# Patient Record
Sex: Female | Born: 1950 | Race: Black or African American | Hispanic: No | Marital: Married | Smoking: Never smoker
Health system: Southern US, Community
[De-identification: ages and names within clinical notes are randomized; demographics above are authoritative.]

## PROBLEM LIST (undated history)

## (undated) DIAGNOSIS — M1990 Unspecified osteoarthritis, unspecified site: Secondary | ICD-10-CM

## (undated) DIAGNOSIS — R112 Nausea with vomiting, unspecified: Secondary | ICD-10-CM

## (undated) DIAGNOSIS — M5136 Other intervertebral disc degeneration, lumbar region: Secondary | ICD-10-CM

## (undated) DIAGNOSIS — Z9889 Other specified postprocedural states: Secondary | ICD-10-CM

## (undated) DIAGNOSIS — M51369 Other intervertebral disc degeneration, lumbar region without mention of lumbar back pain or lower extremity pain: Secondary | ICD-10-CM

## (undated) DIAGNOSIS — E119 Type 2 diabetes mellitus without complications: Secondary | ICD-10-CM

## (undated) DIAGNOSIS — I10 Essential (primary) hypertension: Secondary | ICD-10-CM

## (undated) DIAGNOSIS — K219 Gastro-esophageal reflux disease without esophagitis: Secondary | ICD-10-CM

## (undated) HISTORY — PX: ABDOMINAL HYSTERECTOMY: SHX81

## (undated) HISTORY — PX: EYE SURGERY: SHX253

## (undated) HISTORY — PX: JOINT REPLACEMENT: SHX530

## (undated) HISTORY — PX: CHOLECYSTECTOMY: SHX55

---

## 2003-12-08 ENCOUNTER — Other Ambulatory Visit: Payer: Self-pay

## 2004-10-17 ENCOUNTER — Ambulatory Visit: Payer: Self-pay | Admitting: Obstetrics and Gynecology

## 2006-05-01 ENCOUNTER — Ambulatory Visit: Payer: Self-pay | Admitting: Obstetrics and Gynecology

## 2007-08-01 ENCOUNTER — Ambulatory Visit: Payer: Self-pay | Admitting: Obstetrics and Gynecology

## 2008-03-29 ENCOUNTER — Ambulatory Visit: Payer: Self-pay | Admitting: General Practice

## 2008-04-12 ENCOUNTER — Inpatient Hospital Stay: Payer: Self-pay | Admitting: General Practice

## 2008-05-11 ENCOUNTER — Encounter: Payer: Self-pay | Admitting: General Practice

## 2008-06-07 ENCOUNTER — Encounter: Payer: Self-pay | Admitting: General Practice

## 2008-07-05 ENCOUNTER — Encounter: Payer: Self-pay | Admitting: General Practice

## 2008-09-14 ENCOUNTER — Ambulatory Visit: Payer: Self-pay | Admitting: Obstetrics and Gynecology

## 2008-09-23 ENCOUNTER — Ambulatory Visit: Payer: Self-pay | Admitting: Obstetrics and Gynecology

## 2009-09-15 ENCOUNTER — Ambulatory Visit: Payer: Self-pay | Admitting: Obstetrics and Gynecology

## 2010-09-18 ENCOUNTER — Ambulatory Visit: Payer: Self-pay | Admitting: Obstetrics and Gynecology

## 2011-10-02 ENCOUNTER — Ambulatory Visit: Payer: Self-pay | Admitting: Obstetrics and Gynecology

## 2012-10-07 ENCOUNTER — Ambulatory Visit: Payer: Self-pay | Admitting: Obstetrics and Gynecology

## 2012-11-10 ENCOUNTER — Ambulatory Visit: Payer: Self-pay | Admitting: Unknown Physician Specialty

## 2013-08-24 DIAGNOSIS — IMO0002 Reserved for concepts with insufficient information to code with codable children: Secondary | ICD-10-CM | POA: Insufficient documentation

## 2013-09-16 DIAGNOSIS — K219 Gastro-esophageal reflux disease without esophagitis: Secondary | ICD-10-CM | POA: Insufficient documentation

## 2013-09-16 DIAGNOSIS — Z780 Asymptomatic menopausal state: Secondary | ICD-10-CM | POA: Insufficient documentation

## 2013-09-16 DIAGNOSIS — J302 Other seasonal allergic rhinitis: Secondary | ICD-10-CM | POA: Insufficient documentation

## 2013-09-16 DIAGNOSIS — E785 Hyperlipidemia, unspecified: Secondary | ICD-10-CM | POA: Insufficient documentation

## 2013-09-16 DIAGNOSIS — Z6841 Body Mass Index (BMI) 40.0 and over, adult: Secondary | ICD-10-CM | POA: Insufficient documentation

## 2013-09-16 DIAGNOSIS — E1169 Type 2 diabetes mellitus with other specified complication: Secondary | ICD-10-CM | POA: Insufficient documentation

## 2013-09-16 DIAGNOSIS — I10 Essential (primary) hypertension: Secondary | ICD-10-CM | POA: Insufficient documentation

## 2013-10-19 ENCOUNTER — Ambulatory Visit: Payer: Self-pay

## 2013-10-19 DIAGNOSIS — E7800 Pure hypercholesterolemia, unspecified: Secondary | ICD-10-CM | POA: Insufficient documentation

## 2013-10-21 ENCOUNTER — Ambulatory Visit: Payer: Self-pay

## 2014-09-24 ENCOUNTER — Other Ambulatory Visit: Payer: Self-pay | Admitting: Obstetrics and Gynecology

## 2014-09-24 DIAGNOSIS — Z1231 Encounter for screening mammogram for malignant neoplasm of breast: Secondary | ICD-10-CM

## 2014-10-25 ENCOUNTER — Ambulatory Visit
Admission: RE | Admit: 2014-10-25 | Discharge: 2014-10-25 | Disposition: A | Discharge status: Home / Self Care | Payer: Medicare Other | Source: Ambulatory Visit | Attending: Obstetrics and Gynecology | Admitting: Obstetrics and Gynecology

## 2014-10-25 ENCOUNTER — Other Ambulatory Visit: Payer: Self-pay | Admitting: Obstetrics and Gynecology

## 2014-10-25 DIAGNOSIS — Z1231 Encounter for screening mammogram for malignant neoplasm of breast: Secondary | ICD-10-CM | POA: Diagnosis present

## 2015-10-10 ENCOUNTER — Other Ambulatory Visit: Payer: Self-pay | Admitting: Obstetrics and Gynecology

## 2015-10-25 ENCOUNTER — Other Ambulatory Visit: Payer: Self-pay | Admitting: Family Medicine

## 2015-10-25 DIAGNOSIS — Z1231 Encounter for screening mammogram for malignant neoplasm of breast: Secondary | ICD-10-CM

## 2015-11-10 ENCOUNTER — Other Ambulatory Visit: Payer: Self-pay | Admitting: Family Medicine

## 2015-11-10 ENCOUNTER — Ambulatory Visit
Admission: RE | Admit: 2015-11-10 | Discharge: 2015-11-10 | Disposition: A | Discharge status: Home / Self Care | Payer: Medicare Other | Source: Ambulatory Visit | Attending: Family Medicine | Admitting: Family Medicine

## 2015-11-10 DIAGNOSIS — Z1231 Encounter for screening mammogram for malignant neoplasm of breast: Secondary | ICD-10-CM | POA: Insufficient documentation

## 2016-10-25 ENCOUNTER — Other Ambulatory Visit: Payer: Self-pay | Admitting: Family Medicine

## 2016-10-25 DIAGNOSIS — Z1231 Encounter for screening mammogram for malignant neoplasm of breast: Secondary | ICD-10-CM

## 2016-11-12 ENCOUNTER — Ambulatory Visit
Admission: RE | Admit: 2016-11-12 | Discharge: 2016-11-12 | Disposition: A | Discharge status: Home / Self Care | Payer: Medicare Other | Source: Ambulatory Visit | Attending: Family Medicine | Admitting: Family Medicine

## 2016-11-12 DIAGNOSIS — Z1231 Encounter for screening mammogram for malignant neoplasm of breast: Secondary | ICD-10-CM | POA: Diagnosis not present

## 2017-11-05 ENCOUNTER — Other Ambulatory Visit: Payer: Self-pay | Admitting: Family Medicine

## 2017-11-05 DIAGNOSIS — Z1231 Encounter for screening mammogram for malignant neoplasm of breast: Secondary | ICD-10-CM

## 2017-11-20 DIAGNOSIS — Z96652 Presence of left artificial knee joint: Secondary | ICD-10-CM | POA: Insufficient documentation

## 2017-11-28 ENCOUNTER — Ambulatory Visit
Admission: RE | Admit: 2017-11-28 | Discharge: 2017-11-28 | Disposition: A | Discharge status: Home / Self Care | Payer: Medicare Other | Source: Ambulatory Visit | Attending: Family Medicine | Admitting: Family Medicine

## 2017-11-28 DIAGNOSIS — Z1231 Encounter for screening mammogram for malignant neoplasm of breast: Secondary | ICD-10-CM | POA: Diagnosis not present

## 2018-11-03 ENCOUNTER — Other Ambulatory Visit: Payer: Self-pay | Admitting: Family Medicine

## 2018-11-03 DIAGNOSIS — Z1231 Encounter for screening mammogram for malignant neoplasm of breast: Secondary | ICD-10-CM

## 2018-12-11 ENCOUNTER — Ambulatory Visit
Admission: RE | Admit: 2018-12-11 | Discharge: 2018-12-11 | Disposition: A | Discharge status: Home / Self Care | Payer: Medicare Other | Source: Ambulatory Visit | Attending: Family Medicine | Admitting: Family Medicine

## 2018-12-11 DIAGNOSIS — Z1231 Encounter for screening mammogram for malignant neoplasm of breast: Secondary | ICD-10-CM | POA: Diagnosis present

## 2018-12-16 DIAGNOSIS — Z862 Personal history of diseases of the blood and blood-forming organs and certain disorders involving the immune mechanism: Secondary | ICD-10-CM | POA: Insufficient documentation

## 2019-11-03 ENCOUNTER — Other Ambulatory Visit: Payer: Self-pay | Admitting: Family Medicine

## 2019-11-03 DIAGNOSIS — Z1231 Encounter for screening mammogram for malignant neoplasm of breast: Secondary | ICD-10-CM

## 2019-12-14 ENCOUNTER — Ambulatory Visit
Admission: RE | Admit: 2019-12-14 | Discharge: 2019-12-14 | Disposition: A | Discharge status: Home / Self Care | Payer: Medicare Other | Source: Ambulatory Visit | Attending: Family Medicine | Admitting: Family Medicine

## 2019-12-14 ENCOUNTER — Other Ambulatory Visit: Payer: Self-pay

## 2019-12-14 DIAGNOSIS — Z1231 Encounter for screening mammogram for malignant neoplasm of breast: Secondary | ICD-10-CM | POA: Diagnosis present

## 2020-09-30 NOTE — Discharge Instructions (Signed)
Instructions after Total Knee Replacement   Krystal Rodriguez, Jr., M.D.     Dept. of Orthopaedics & Sports Medicine  [facility]  [address]  [place], [place]  [address]  Phone: [phone]   Fax: [phone]    DIET: Drink plenty of non-alcoholic fluids. Resume your normal diet. Include foods high in fiber.  ACTIVITY:  You may use crutches or a walker with weight-bearing as tolerated, unless instructed otherwise. You may be weaned off of the walker or crutches by your Physical Therapist.  Do NOT place pillows under the knee. Anything placed under the knee could limit your ability to straighten the knee.   Continue doing gentle exercises. Exercising will reduce the pain and swelling, increase motion, and prevent muscle weakness.   Please continue to use the TED compression stockings for 6 weeks. You may remove the stockings at night, but should reapply them in the morning. Do not drive or operate any equipment until instructed.  WOUND CARE:  Continue to use the PolarCare or ice packs periodically to reduce pain and swelling. You may bathe or shower after the staples are removed at the first office visit following surgery.  MEDICATIONS: You may resume your regular medications. Please take the pain medication as prescribed on the medication. Do not take pain medication on an empty stomach. You have been given a prescription for a blood thinner (Lovenox or Coumadin). Please take the medication as instructed. (NOTE: After completing a 2 week course of Lovenox, take one Enteric-coated aspirin once a day. This along with elevation will help reduce the possibility of phlebitis in your operated leg.) Do not drive or drink alcoholic beverages when taking pain medications.  CALL THE OFFICE FOR: Temperature above 101 degrees Excessive bleeding or drainage on the dressing. Excessive swelling, coldness, or paleness of the toes. Persistent nausea and vomiting.  FOLLOW-UP:  You  should have an appointment to return to the office in 10-14 days after surgery. Arrangements have been made for continuation of Physical Therapy (either home therapy or outpatient therapy).   [facility] Department Directory         [email or web address]       [email or web address]          Cardiology  Appointments: [place] - [phone] [place] - [phone]  Endocrinology  Appointments: [place] - [phone] [place] - [phone]  Gastroenterology  Appointments: [place] - [phone] [place] - [phone]        General Surgery   Appointments: [place] - [phone]  Internal Medicine/Family Medicine  Appointments: [place] - [phone] [place] - [phone] [place] - [phone]  Metabolic and Weigh Loss Surgery  Appointments: [place] - [phone]        Neurology  Appointments: [place] - [phone] [place] - [phone]  Neurosurgery  Appointments: [place] - [phone]  Obstetrics & Gynecology  Appointments: [place] - [phone] [place] - [phone]        Pediatrics  Appointments: [place] - [phone] [place] - [phone]  Physiatry  Appointments: [place] -[phone]  Physical Therapy  Appointments: [place] - [phone] [place] - [phone]        Podiatry  Appointments: [place] - [phone] [place] - [phone]  Pulmonology  Appointments: [place] - [phone]  Rheumatology  Appointments: [place] - [phone]        [place] Location: [facility]  [address] [place], [place]  [address]  [place] Location: [facility] [address] [place], [place]  [address]  [place] Location: [facility] [address] [place], [place]  [address]    

## 2020-10-05 DIAGNOSIS — U071 COVID-19: Secondary | ICD-10-CM

## 2020-10-05 HISTORY — DX: COVID-19: U07.1

## 2020-10-25 ENCOUNTER — Other Ambulatory Visit: Payer: Self-pay

## 2020-10-25 ENCOUNTER — Other Ambulatory Visit
Admission: RE | Admit: 2020-10-25 | Discharge: 2020-10-25 | Disposition: A | Discharge status: Home / Self Care | Payer: Medicare Other | Source: Ambulatory Visit | Attending: Orthopedic Surgery | Admitting: Orthopedic Surgery

## 2020-10-25 DIAGNOSIS — Z01818 Encounter for other preprocedural examination: Secondary | ICD-10-CM | POA: Insufficient documentation

## 2020-10-25 HISTORY — DX: Unspecified osteoarthritis, unspecified site: M19.90

## 2020-10-25 HISTORY — DX: Type 2 diabetes mellitus without complications: E11.9

## 2020-10-25 HISTORY — DX: Essential (primary) hypertension: I10

## 2020-10-25 HISTORY — DX: Gastro-esophageal reflux disease without esophagitis: K21.9

## 2020-10-25 LAB — CBC
HCT: 36.4 % (ref 36.0–46.0)
Hemoglobin: 10.8 g/dL — ABNORMAL LOW (ref 12.0–15.0)
MCH: 23.7 pg — ABNORMAL LOW (ref 26.0–34.0)
MCHC: 29.7 g/dL — ABNORMAL LOW (ref 30.0–36.0)
MCV: 80 fL (ref 80.0–100.0)
Platelets: 242 10*3/uL (ref 150–400)
RBC: 4.55 MIL/uL (ref 3.87–5.11)
RDW: 15 % (ref 11.5–15.5)
WBC: 8.3 10*3/uL (ref 4.0–10.5)
nRBC: 0 % (ref 0.0–0.2)

## 2020-10-25 LAB — URINALYSIS, ROUTINE W REFLEX MICROSCOPIC
Bilirubin Urine: NEGATIVE
Glucose, UA: NEGATIVE mg/dL
Hgb urine dipstick: NEGATIVE
Ketones, ur: NEGATIVE mg/dL
Leukocytes,Ua: NEGATIVE
Nitrite: NEGATIVE
Protein, ur: NEGATIVE mg/dL
Specific Gravity, Urine: 1.019 (ref 1.005–1.030)
pH: 5 (ref 5.0–8.0)

## 2020-10-25 LAB — PROTIME-INR
INR: 1 (ref 0.8–1.2)
Prothrombin Time: 13.7 seconds (ref 11.4–15.2)

## 2020-10-25 LAB — COMPREHENSIVE METABOLIC PANEL
ALT: 11 U/L (ref 0–44)
AST: 18 U/L (ref 15–41)
Albumin: 3.5 g/dL (ref 3.5–5.0)
Alkaline Phosphatase: 53 U/L (ref 38–126)
Anion gap: 6 (ref 5–15)
BUN: 15 mg/dL (ref 8–23)
CO2: 32 mmol/L (ref 22–32)
Calcium: 8.8 mg/dL — ABNORMAL LOW (ref 8.9–10.3)
Chloride: 101 mmol/L (ref 98–111)
Creatinine, Ser: 0.85 mg/dL (ref 0.44–1.00)
GFR, Estimated: >60 mL/min (ref >60)
Glucose, Bld: 100 mg/dL — ABNORMAL HIGH (ref 70–99)
Potassium: 3.8 mmol/L (ref 3.5–5.1)
Sodium: 139 mmol/L (ref 135–145)
Total Bilirubin: 0.6 mg/dL (ref 0.3–1.2)
Total Protein: 7.3 g/dL (ref 6.5–8.1)

## 2020-10-25 LAB — APTT: aPTT: 31 seconds (ref 24–36)

## 2020-10-25 LAB — TYPE AND SCREEN
ABO/RH(D): O POS
Antibody Screen: NEGATIVE

## 2020-10-25 LAB — SURGICAL PCR SCREEN
MRSA, PCR: NEGATIVE
Staphylococcus aureus: POSITIVE — AB

## 2020-10-25 LAB — SEDIMENTATION RATE: Sed Rate: 45 mm/hr — ABNORMAL HIGH (ref 0–30)

## 2020-10-25 LAB — C-REACTIVE PROTEIN: CRP: 3.1 mg/dL — ABNORMAL HIGH (ref <1.0)

## 2020-10-25 NOTE — Patient Instructions (Addendum)
Your procedure is scheduled on: Friday 11/04/20 Report to Day Surgery.  Go to registration desk in medical mall first To find out your arrival time please call [phone] between 1PM - 3PM on Thurs. 11/03/20.  Remember: Instructions that are not followed completely may result in serious medical risk,  up to and including death, or upon the discretion of your surgeon and anesthesiologist your  surgery may need to be rescheduled.     _X__ 1. Do not eat food after midnight the night before your procedure.                 No chewing gum or hard candies. You may drink clear liquids up to 2 hours                 before you are scheduled to arrive for your surgery- DO not drink clear                 liquids within 2 hours of the start of your surgery.                 Clear Liquids include:  water,  G2 or                  Gatorade Zero (avoid Red/Purple/Blue), Black Coffee or Tea (Do not add                 anything to coffee or tea). ___x__2.    **If you  are diabetic you will be provided with an alternative drink, Gatorade Zero or G2.  __X__2.  On the morning of surgery brush your teeth with toothpaste and water, you                may rinse your mouth with mouthwash if you wish.  Do not swallow any toothpaste of mouthwash.     __ 3.  No Alcohol for 24 hours before or after surgery.   ___ 4.  Do Not Smoke or use e-cigarettes For 24 Hours Prior to Your Surgery.                 Do not use any chewable tobacco products for at least 6 hours prior to                 Surgery.  ___  5.  Do not use any recreational drugs (marijuana, cocaine, heroin, ecstasy, MDMA or other)                For at least one week prior to your surgery.  Combination of these drugs with anesthesia                May have life threatening results.  ____  6.  Bring all medications with you on the day of surgery if instructed.   __x__  7.  Notify your doctor if there is any change in your medical  condition      (cold, fever, infections).     Do not wear jewelry, make-up, hairpins, clips or nail polish. Do not wear lotions, powders, or perfumes. You may wear deodorant. Do not shave 48 hours prior to surgery.  Do not bring valuables to the hospital.    [facility] is not responsible for any belongings or valuables.  Contacts, dentures or bridgework may not be worn into surgery. Leave your suitcase in the car. After surgery it may be brought to your room. For patients admitted to the hospital, discharge  time is determined by your treatment team.   Patients discharged the day of surgery will not be allowed to drive home.   Make arrangements for someone to be with you for the first 24 hours of your Same Day Discharge.    Please read over the following fact sheets that you were given:   Incentive spiromerter    ___x_ Take these medicines the morning of surgery with A SIP OF WATER:    1. acetaminophen (TYLENOL) 500 MG tablet if needed  2. omeprazole (PRILOSEC) 20 MG capsule  take dose the night before and the morning of surgery  3. May take allergy medication if needed  4.  5.  6.  ____ Fleet Enema (as directed)   __x__ Use CHG Soap (or wipes) as directed  ____ Use Benzoyl Peroxide Gel as instructed  ____ Use inhalers on the day of surgery  __x__ Stop metformin 2 days prior to surgery Last dose on Tues. 6/28   ____ Take 1/2 of usual insulin dose the night before surgery. No insulin the morning          of surgery.   __x__ Stop aspirin 1 week prior to surgery  ___x_ Stop Anti-inflammatories no ibuprofen aleve 1 week prior to surgery   ___x_ Stop supplements until after surgery.  No need to stop Vit. D  Please stop Garlic 1 week prior to surgery.  ____ Bring C-Pap to the hospital.    If you have any questions regarding your pre-procedure instructions,  Please call Pre-admit Testing at [phone] [facility]

## 2020-10-26 LAB — HEMOGLOBIN A1C
Hgb A1c MFr Bld: 6.4 % — ABNORMAL HIGH (ref 4.8–5.6)
Mean Plasma Glucose: 137 mg/dL

## 2020-10-26 LAB — URINE CULTURE
Culture: 20000 — AB
Special Requests: NORMAL

## 2020-11-02 ENCOUNTER — Other Ambulatory Visit: Payer: Self-pay

## 2020-11-02 ENCOUNTER — Other Ambulatory Visit
Admission: RE | Admit: 2020-11-02 | Discharge: 2020-11-02 | Disposition: A | Discharge status: Home / Self Care | Payer: Medicare Other | Source: Ambulatory Visit | Attending: Orthopedic Surgery | Admitting: Orthopedic Surgery

## 2020-11-02 DIAGNOSIS — U071 COVID-19: Secondary | ICD-10-CM | POA: Diagnosis not present

## 2020-11-02 DIAGNOSIS — Z01812 Encounter for preprocedural laboratory examination: Secondary | ICD-10-CM | POA: Insufficient documentation

## 2020-11-02 LAB — SARS CORONAVIRUS 2 (TAT 6-24 HRS): SARS Coronavirus 2: POSITIVE — AB

## 2020-11-03 ENCOUNTER — Encounter (HOSPITAL_COMMUNITY): Payer: Self-pay | Admitting: Anesthesiology

## 2020-11-04 ENCOUNTER — Inpatient Hospital Stay: Admission: RE | Admit: 2020-11-04 | Payer: Medicare Other | Source: Home / Self Care | Admitting: Orthopedic Surgery

## 2020-11-04 ENCOUNTER — Encounter: Admission: RE | Payer: Self-pay | Source: Home / Self Care

## 2020-11-04 SURGERY — ARTHROPLASTY, KNEE, TOTAL, USING IMAGELESS COMPUTER-ASSISTED NAVIGATION
Anesthesia: Choice | Site: Knee | Laterality: Right

## 2020-11-08 ENCOUNTER — Other Ambulatory Visit: Payer: Self-pay | Admitting: Family Medicine

## 2020-11-08 DIAGNOSIS — Z1231 Encounter for screening mammogram for malignant neoplasm of breast: Secondary | ICD-10-CM

## 2020-12-14 ENCOUNTER — Ambulatory Visit
Admission: RE | Admit: 2020-12-14 | Discharge: 2020-12-14 | Disposition: A | Discharge status: Home / Self Care | Payer: Medicare Other | Source: Ambulatory Visit | Attending: Family Medicine | Admitting: Family Medicine

## 2020-12-14 ENCOUNTER — Other Ambulatory Visit: Payer: Self-pay

## 2020-12-14 DIAGNOSIS — Z1231 Encounter for screening mammogram for malignant neoplasm of breast: Secondary | ICD-10-CM | POA: Diagnosis not present

## 2021-01-08 NOTE — Discharge Instructions (Signed)
Instructions after Total Knee Replacement   Edsel Shives P. Zyionna Pesce, Jr., M.D.     Dept. of Orthopaedics & Sports Medicine  [facility]  [address]  [place], [place]  [address]  Phone: [phone]   Fax: [phone]    DIET: Drink plenty of non-alcoholic fluids. Resume your normal diet. Include foods high in fiber.  ACTIVITY:  You may use crutches or a walker with weight-bearing as tolerated, unless instructed otherwise. You may be weaned off of the walker or crutches by your Physical Therapist.  Do NOT place pillows under the knee. Anything placed under the knee could limit your ability to straighten the knee.   Continue doing gentle exercises. Exercising will reduce the pain and swelling, increase motion, and prevent muscle weakness.   Please continue to use the TED compression stockings for 6 weeks. You may remove the stockings at night, but should reapply them in the morning. Do not drive or operate any equipment until instructed.  WOUND CARE:  Continue to use the PolarCare or ice packs periodically to reduce pain and swelling. You may bathe or shower after the staples are removed at the first office visit following surgery.  MEDICATIONS: You may resume your regular medications. Please take the pain medication as prescribed on the medication. Do not take pain medication on an empty stomach. You have been given a prescription for a blood thinner (Lovenox or Coumadin). Please take the medication as instructed. (NOTE: After completing a 2 week course of Lovenox, take one Enteric-coated aspirin once a day. This along with elevation will help reduce the possibility of phlebitis in your operated leg.) Do not drive or drink alcoholic beverages when taking pain medications.  CALL THE OFFICE FOR: Temperature above 101 degrees Excessive bleeding or drainage on the dressing. Excessive swelling, coldness, or paleness of the toes. Persistent nausea and vomiting.  FOLLOW-UP:  You  should have an appointment to return to the office in 10-14 days after surgery. Arrangements have been made for continuation of Physical Therapy (either home therapy or outpatient therapy).   [facility] Department Directory         [email or web address]       [email or web address]          Cardiology  Appointments: [place] - [phone] [place] - [phone]  Endocrinology  Appointments: [place] - [phone] [place] - [phone]  Gastroenterology  Appointments: [place] - [phone] [place] - [phone]        General Surgery   Appointments: [place] - [phone]  Internal Medicine/Family Medicine  Appointments: [place] - [phone] [place] - [phone] [place] - [phone]  Metabolic and Weigh Loss Surgery  Appointments: [place] - [phone]        Neurology  Appointments: [place] - [phone] [place] - [phone]  Neurosurgery  Appointments: [place] - [phone]  Obstetrics & Gynecology  Appointments: [place] - [phone] [place] - [phone]        Pediatrics  Appointments: [place] - [phone] [place] - [phone]  Physiatry  Appointments: [place] -[phone]  Physical Therapy  Appointments: [place] - [phone] [place] - [phone]        Podiatry  Appointments: [place] - [phone] [place] - [phone]  Pulmonology  Appointments: [place] - [phone]  Rheumatology  Appointments: [place] - [phone]        [place] Location: [facility]  [address] , [place]  [address]  [place] Location: [facility] [address] [place], [place]  [address]  [place] Location: [facility] [address] [place], [place]  [address]    

## 2021-01-12 ENCOUNTER — Other Ambulatory Visit: Payer: Self-pay

## 2021-01-12 ENCOUNTER — Encounter
Admission: RE | Admit: 2021-01-12 | Discharge: 2021-01-12 | Disposition: A | Discharge status: Home / Self Care | Payer: Medicare Other | Source: Ambulatory Visit | Attending: Orthopedic Surgery | Admitting: Orthopedic Surgery

## 2021-01-12 DIAGNOSIS — Z01812 Encounter for preprocedural laboratory examination: Secondary | ICD-10-CM | POA: Diagnosis not present

## 2021-01-12 DIAGNOSIS — Z112 Encounter for screening for other bacterial diseases: Secondary | ICD-10-CM | POA: Insufficient documentation

## 2021-01-12 HISTORY — DX: Other intervertebral disc degeneration, lumbar region without mention of lumbar back pain or lower extremity pain: M51.369

## 2021-01-12 HISTORY — DX: Other intervertebral disc degeneration, lumbar region: M51.36

## 2021-01-12 HISTORY — DX: Other specified postprocedural states: Z98.890

## 2021-01-12 HISTORY — DX: Nausea with vomiting, unspecified: R11.2

## 2021-01-12 LAB — SURGICAL PCR SCREEN
MRSA, PCR: NEGATIVE
Staphylococcus aureus: POSITIVE — AB

## 2021-01-12 LAB — PROTIME-INR
INR: 1 (ref 0.8–1.2)
Prothrombin Time: 13.4 seconds (ref 11.4–15.2)

## 2021-01-12 LAB — CBC
HCT: 38.5 % (ref 36.0–46.0)
Hemoglobin: 11.7 g/dL — ABNORMAL LOW (ref 12.0–15.0)
MCH: 24.2 pg — ABNORMAL LOW (ref 26.0–34.0)
MCHC: 30.4 g/dL (ref 30.0–36.0)
MCV: 79.5 fL — ABNORMAL LOW (ref 80.0–100.0)
Platelets: 263 10*3/uL (ref 150–400)
RBC: 4.84 MIL/uL (ref 3.87–5.11)
RDW: 14.8 % (ref 11.5–15.5)
WBC: 10.1 10*3/uL (ref 4.0–10.5)
nRBC: 0 % (ref 0.0–0.2)

## 2021-01-12 LAB — TYPE AND SCREEN
ABO/RH(D): O POS
Antibody Screen: NEGATIVE

## 2021-01-12 LAB — URINALYSIS, ROUTINE W REFLEX MICROSCOPIC
Bacteria, UA: NONE SEEN
Bilirubin Urine: NEGATIVE
Glucose, UA: NEGATIVE mg/dL
Hgb urine dipstick: NEGATIVE
Ketones, ur: NEGATIVE mg/dL
Leukocytes,Ua: NEGATIVE
Nitrite: NEGATIVE
Protein, ur: NEGATIVE mg/dL
Specific Gravity, Urine: 1.01 (ref 1.005–1.030)
pH: 6.5 (ref 5.0–8.0)

## 2021-01-12 LAB — COMPREHENSIVE METABOLIC PANEL
ALT: 15 U/L (ref 0–44)
AST: 25 U/L (ref 15–41)
Albumin: 3.7 g/dL (ref 3.5–5.0)
Alkaline Phosphatase: 56 U/L (ref 38–126)
Anion gap: 8 (ref 5–15)
BUN: 18 mg/dL (ref 8–23)
CO2: 30 mmol/L (ref 22–32)
Calcium: 8.9 mg/dL (ref 8.9–10.3)
Chloride: 100 mmol/L (ref 98–111)
Creatinine, Ser: 0.97 mg/dL (ref 0.44–1.00)
GFR, Estimated: >60 mL/min (ref >60)
Glucose, Bld: 92 mg/dL (ref 70–99)
Potassium: 4 mmol/L (ref 3.5–5.1)
Sodium: 138 mmol/L (ref 135–145)
Total Bilirubin: 0.6 mg/dL (ref 0.3–1.2)
Total Protein: 7.4 g/dL (ref 6.5–8.1)

## 2021-01-12 LAB — HEMOGLOBIN A1C
Hgb A1c MFr Bld: 6.2 % — ABNORMAL HIGH (ref 4.8–5.6)
Mean Plasma Glucose: 131.24 mg/dL

## 2021-01-12 LAB — APTT: aPTT: 32 seconds (ref 24–36)

## 2021-01-12 LAB — SEDIMENTATION RATE: Sed Rate: 33 mm/hr — ABNORMAL HIGH (ref 0–30)

## 2021-01-12 LAB — C-REACTIVE PROTEIN: CRP: 2.7 mg/dL — ABNORMAL HIGH (ref <1.0)

## 2021-01-12 NOTE — Patient Instructions (Addendum)
Your procedure is scheduled on:01-25-21 Wednesday Report to the Registration Desk on the 1st floor of the Medical Mall.Then proceed to the 2nd floor Surgery Desk in the Medical Mall To find out your arrival time, please call [phone] between 1PM - 3PM on:01-24-21 Tuesday  REMEMBER: Instructions that are not followed completely may result in serious medical risk, up to and including death; or upon the discretion of your surgeon and anesthesiologist your surgery may need to be rescheduled.  Do not eat food after midnight the night before surgery.  No gum chewing, lozengers or hard candies.  You may however, drink Water up to 2 hours before you are scheduled to arrive for your surgery. Do not drink anything within 2 hours of your scheduled arrival time  Type 1 and Type 2 diabetics should only drink water.  TAKE THESE MEDICATIONS THE MORNING OF SURGERY WITH A SIP OF WATER: -Prilosec (Omeprazole)-take one the night before and one on the morning of surgery - helps to prevent nausea after surgery.)  Stop Metformin 2 days prior to surgery-Last dose on 01-22-21 Sunday  Stop your 81 mg Aspirin 7 days prior to surgery-Last dose on 01-17-21 Tuesday  One week prior to surgery: Stop Anti-inflammatories (NSAIDS) such as Advil, Aleve, Ibuprofen, Motrin, Naproxen, Naprosyn and Aspirin based products such as Excedrin, Goodys Powder, BC Powder.You may however, continue to take Tylenol if needed for pain up until the day of surgery.  Stop ANY OVER THE COUNTER supplements/vitamins 7 days prior to surgery (Vitamin D3)  No Alcohol for 24 hours before or after surgery.  No Smoking including e-cigarettes for 24 hours prior to surgery.  No chewable tobacco products for at least 6 hours prior to surgery.  No nicotine patches on the day of surgery.  Do not use any "recreational" drugs for at least a week prior to your surgery.  Please be advised that the combination of cocaine and anesthesia may have negative  outcomes, up to and including death. If you test positive for cocaine, your surgery will be cancelled.  On the morning of surgery brush your teeth with toothpaste and water, you may rinse your mouth with mouthwash if you wish. Do not swallow any toothpaste or mouthwash.  Do not wear jewelry, make-up, hairpins, clips or nail polish.  Do not wear lotions, powders, or perfumes.   Do not shave body from the neck down 48 hours prior to surgery just in case you cut yourself which could leave a site for infection.  Also, freshly shaved skin may become irritated if using the CHG soap.  Contact lenses, hearing aids and dentures may not be worn into surgery.  Do not bring valuables to the hospital. [facility] is not responsible for any missing/lost belongings or valuables.   Use CHG Soap as directed on instruction sheet.  Notify your doctor if there is any change in your medical condition (cold, fever, infection).  Wear comfortable clothing (specific to your surgery type) to the hospital.  After surgery, you can help prevent lung complications by doing breathing exercises.  Take deep breaths and cough every 1-2 hours. Your doctor may order a device called an Incentive Spirometer to help you take deep breaths. When coughing or sneezing, hold a pillow firmly against your incision with both hands. This is called "splinting." Doing this helps protect your incision. It also decreases belly discomfort.  If you are being admitted to the hospital overnight, leave your suitcase in the car. After surgery it may be brought  to your room.  If you are being discharged the day of surgery, you will not be allowed to drive home. You will need a responsible adult (18 years or older) to drive you home and stay with you that night.   If you are taking public transportation, you will need to have a responsible adult (18 years or older) with you. Please confirm with your physician that it is acceptable to use  public transportation.   Please call the Pre-admissions Testing Dept. at [phone] if you have any questions about these instructions.  Surgery Visitation Policy:  Patients undergoing a surgery or procedure may have one family member or support person with them as long as that person is not COVID-19 positive or experiencing its symptoms.  That person may remain in the waiting area during the procedure.  Inpatient Visitation:    Visiting hours are 7 a.m. to 8 p.m. Inpatients will be allowed two visitors daily. The visitors may change each day during the patient's stay. No visitors under the age of 5. Any visitor under the age of 29 must be accompanied by an adult. The visitor must pass COVID-19 screenings, use hand sanitizer when entering and exiting the patient's room and wear a mask at all times, including in the patient's room. Patients must also wear a mask when staff or their visitor are in the room. Masking is required regardless of vaccination status.

## 2021-01-14 LAB — URINE CULTURE: Special Requests: NORMAL

## 2021-01-23 ENCOUNTER — Other Ambulatory Visit: Payer: Self-pay

## 2021-01-23 ENCOUNTER — Other Ambulatory Visit
Admission: RE | Admit: 2021-01-23 | Discharge: 2021-01-23 | Disposition: A | Discharge status: Home / Self Care | Payer: Medicare Other | Source: Ambulatory Visit | Attending: Orthopedic Surgery | Admitting: Orthopedic Surgery

## 2021-01-23 DIAGNOSIS — Z20822 Contact with and (suspected) exposure to covid-19: Secondary | ICD-10-CM | POA: Insufficient documentation

## 2021-01-23 DIAGNOSIS — Z01812 Encounter for preprocedural laboratory examination: Secondary | ICD-10-CM | POA: Insufficient documentation

## 2021-01-23 LAB — SARS CORONAVIRUS 2 (TAT 6-24 HRS): SARS Coronavirus 2: NEGATIVE

## 2021-01-24 NOTE — H&P (Signed)
ORTHOPAEDIC HISTORY & PHYSICAL Michelene Gardener, [place] - 01/17/2021 9:45 AM EDT Formatting of this note is different from the original. [facility] Chief Complaint:   Chief Complaint  Patient presents with   Knee Pain  H & P RIGHT KNEE   History of Present Illness:   Krystal Rodriguez is a 70 y.o. female that presents to clinic today for her preoperative history and evaluation. Patient presents unaccompanied. The patient is scheduled to undergo a right total knee arthroplasty on 01/25/21 by Dr. Ernest Pine. Her pain began several years ago. The pain is located primarily along the medial aspect of the knee. She describes her pain as worse with weightbearing. She reports associated swelling with significant giving way of the knee. She denies associated numbness or tingling, denies locking.   The patient's symptoms have progressed to the point that they decrease her quality of life. The patient has previously undergone conservative treatment including NSAIDS and injections to the knee without adequate control of her symptoms.  Patient lives at home with her husband. Denies significant cardiac history, history of blood clots, or lumbar surgery.   Patient works as bus Archivist.  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   blood glucose diagnostic (CONTOUR NEXT STRIPS) test strip Use 1 each as directed. Check CBG's twice weekly. Dx: 250.00 50 each 11   cholecalciferol (VITAMIN D3) 1,000 unit capsule Take 5,000 Units by mouth once daily   enalapril (VASOTEC) 10 MG tablet TAKE 1 TABLET BY MOUTH EVERY DAY 90 tablet 1   garlic 1,000 mg Cap Take 1 capsule by mouth once daily   lancets (MICROLET LANCET) Use 1 each as directed. Check CBG's twice weekly. Dx: 250.00 50 each 11   levocetirizine (XYZAL) 5 MG tablet Take 5 mg by mouth every evening   metFORMIN (GLUCOPHAGE) 1000 MG tablet TAKE 1 TABLET BY MOUTH TWICE A DAY WITH MEALS 180 tablet 1   montelukast (SINGULAIR) 10 mg tablet TAKE 1 TABLET BY MOUTH EVERY DAY IN THE EVENING 90 tablet 1   omeprazole (PRILOSEC) 20 MG DR capsule TAKE 1 CAPSULE BY MOUTH EVERY DAY 90 capsule 1   pravastatin (PRAVACHOL) 10 MG tablet TAKE 1 TABLET BY MOUTH EVERY DAY AT NIGHT 90 tablet 1   triamterene-hydrochlorothiazide (MAXZIDE-25) 37.5-25 mg tablet TAKE 1 TABLET BY MOUTH EVERY DAY 90 tablet 1   cetirizine (ZYRTEC) 10 MG tablet Take 10 mg by mouth every morning   No current facility-administered medications for this visit.   Allergies: No Known Allergies  Social History:  Social History   Socioeconomic History   Marital status: Married  Spouse name: Chrissie Noa   Number of children: 4   Years of education: 12   Highest education level: High school  graduate  Occupational History   Occupation: Part-time- Engineer, maintenance (IT)  Tobacco Use   Smoking status: Never Smoker   Smokeless tobacco: Never Used  Building services engineer Use: Never used  Substance and Sexual Activity   Alcohol use: No   Drug use: No   Sexual activity: Defer  Social History Narrative  Marital Status- Married  Lives with husband and 2 children  Employment- Retired  Exercise hx- Occasional- exercises on treadmill  Religious Affiliation- None    Family History:  Family History  Problem Relation Age of Onset   No Known Problems Father   No Known Problems Mother   Review of Systems:   A 10+ ROS was performed, reviewed, and the pertinent orthopaedic findings are documented in the HPI.   Physical Examination:   BP (!) 150/80 (BP Location: Left upper arm, Patient Position: Sitting, BP Cuff Size: Large Adult)  Ht 170.2 cm (5\' 7" )  Wt (!) 128.5 kg (283 lb 3.2 oz)  BMI 44.36 kg/m   Patient is a well-developed, well-nourished female in no acute distress. Patient has normal mood and affect. Patient is alert and oriented to person, place, and time.   HEENT: Atraumatic, normocephalic. Pupils equal and reactive to light. Extraocular motion intact. Noninjected sclera.  Cardiovascular: Regular rate and rhythm, with no murmurs, rubs, or gallops. Distal pulses palpable.  Respiratory: Lungs clear to auscultation bilaterally.   Right  Knee:    Soft tissue swelling: mild    Effusion:                   none    Erythema:                 none    Crepitance:               mild    Tenderness:             medial    Alignment:                relative varus    Mediolateral laxity:   medial pseudolaxity    Posterior sag:           negative    Patellar tracking:      Good tracking without evidence of subluxation or tilt    Atrophy:                    No significant atrophy.                                       Quadriceps tone was fair to good.    Range of motion:     0/8/90 degrees  Sensation intact over the saphenous, lateral sural cutaneous, superficial fibular, and deep fibular nerve distributions.  Tests Performed/Reviewed:  X-rays  X-ray knee right 3 views  Result Date: 01/17/2021 3 views of the right knee were obtained. Images reveal severe loss of medial compartment joint space with significant osteophyte formation. No fractures or dislocations. No osseous abnormality noted.   Personally ordered and interpreted today's xrays.    Impression:   ICD-10-CM  1. Primary osteoarthritis of right knee M17.11   Plan:   The patient has end-stage degenerative changes of the right knee. It was explained to the patient that the condition is progressive in nature. Having failed conservative treatment, the patient has elected to proceed with a  total joint arthroplasty. The patient will undergo a total joint arthroplasty with Dr. Ernest Pine. The risks of surgery, including blood clot and infection, were discussed with the patient. Measures to reduce these risks, including the use of anticoagulation, perioperative antibiotics, and early ambulation were discussed. The importance of postoperative physical therapy was discussed with the patient. The patient elects to proceed with surgery. The patient is instructed to stop all blood thinners prior to surgery. The patient is instructed to call the hospital the day before surgery to learn of the proper arrival time.   Contact our office with any questions or concerns. Follow up as indicated, or sooner should any new problems arise, if conditions worsen, or if they are otherwise concerned.   Michelene Gardener, PA -C [facility] [address] [place], [place] [address] Phone: [phone]  This note was generated in part with voice recognition software and I apologize for any typographical errors that were not detected and corrected.  Electronically signed by Michelene Gardener, PA at 01/17/2021 10:49 AM EDT

## 2021-01-25 ENCOUNTER — Encounter: Payer: Self-pay | Admitting: Orthopedic Surgery

## 2021-01-25 ENCOUNTER — Other Ambulatory Visit: Payer: Self-pay

## 2021-01-25 ENCOUNTER — Inpatient Hospital Stay: Payer: Medicare Other

## 2021-01-25 ENCOUNTER — Inpatient Hospital Stay: Payer: Medicare Other | Admitting: Certified Registered"

## 2021-01-25 ENCOUNTER — Inpatient Hospital Stay
Admission: RE | Admit: 2021-01-25 | Discharge: 2021-01-28 | DRG: 470 | Disposition: A | Discharge status: Home Health | Payer: Medicare Other | Attending: Orthopedic Surgery | Admitting: Orthopedic Surgery

## 2021-01-25 ENCOUNTER — Encounter
Admission: RE | Disposition: A | Discharge status: Home / Self Care | Payer: Self-pay | Source: Home / Self Care | Attending: Orthopedic Surgery

## 2021-01-25 ENCOUNTER — Inpatient Hospital Stay: Payer: Medicare Other | Admitting: Urgent Care

## 2021-01-25 DIAGNOSIS — Z8616 Personal history of COVID-19: Secondary | ICD-10-CM

## 2021-01-25 DIAGNOSIS — Z20822 Contact with and (suspected) exposure to covid-19: Secondary | ICD-10-CM | POA: Diagnosis present

## 2021-01-25 DIAGNOSIS — K219 Gastro-esophageal reflux disease without esophagitis: Secondary | ICD-10-CM | POA: Diagnosis present

## 2021-01-25 DIAGNOSIS — R112 Nausea with vomiting, unspecified: Secondary | ICD-10-CM | POA: Diagnosis not present

## 2021-01-25 DIAGNOSIS — E785 Hyperlipidemia, unspecified: Secondary | ICD-10-CM | POA: Diagnosis present

## 2021-01-25 DIAGNOSIS — Z6841 Body Mass Index (BMI) 40.0 and over, adult: Secondary | ICD-10-CM | POA: Diagnosis not present

## 2021-01-25 DIAGNOSIS — M5136 Other intervertebral disc degeneration, lumbar region: Secondary | ICD-10-CM | POA: Diagnosis present

## 2021-01-25 DIAGNOSIS — I10 Essential (primary) hypertension: Secondary | ICD-10-CM | POA: Diagnosis present

## 2021-01-25 DIAGNOSIS — M1711 Unilateral primary osteoarthritis, right knee: Secondary | ICD-10-CM | POA: Diagnosis present

## 2021-01-25 DIAGNOSIS — E119 Type 2 diabetes mellitus without complications: Secondary | ICD-10-CM | POA: Diagnosis present

## 2021-01-25 DIAGNOSIS — Z96659 Presence of unspecified artificial knee joint: Secondary | ICD-10-CM

## 2021-01-25 HISTORY — PX: KNEE ARTHROPLASTY: SHX992

## 2021-01-25 LAB — GLUCOSE, CAPILLARY
Glucose-Capillary: 109 mg/dL — ABNORMAL HIGH (ref 70–99)
Glucose-Capillary: 204 mg/dL — ABNORMAL HIGH (ref 70–99)
Glucose-Capillary: 230 mg/dL — ABNORMAL HIGH (ref 70–99)

## 2021-01-25 LAB — ABO/RH: ABO/RH(D): O POS

## 2021-01-25 SURGERY — ARTHROPLASTY, KNEE, TOTAL, USING IMAGELESS COMPUTER-ASSISTED NAVIGATION
Anesthesia: General | Site: Knee | Laterality: Right

## 2021-01-25 MED ORDER — CHLORHEXIDINE GLUCONATE 0.12 % MT SOLN: 15.0000 mL | Freq: Once | OROMUCOSAL | Status: AC

## 2021-01-25 MED ORDER — METOCLOPRAMIDE HCL 10 MG PO TABS
10.0000 mg | ORAL_TABLET | Freq: Three times a day (TID) | ORAL | Status: AC
  Administered 2021-01-26 – 2021-01-27 (×8): 10 mg via ORAL
  Filled 2021-01-25 (×8): qty 1

## 2021-01-25 MED ORDER — PRAVASTATIN SODIUM 20 MG PO TABS
10.0000 mg | ORAL_TABLET | Freq: Every evening | ORAL | Status: DC
  Administered 2021-01-25 – 2021-01-27 (×3): 10 mg via ORAL
  Filled 2021-01-25 (×3): qty 1

## 2021-01-25 MED ORDER — TRIAMTERENE-HCTZ 37.5-25 MG PO TABS
1.0000 | ORAL_TABLET | ORAL | Status: DC
  Administered 2021-01-26 – 2021-01-28 (×3): 1 via ORAL
  Filled 2021-01-25 (×3): qty 1

## 2021-01-25 MED ORDER — PHENOL 1.4 % MT LIQD
1.0000 | OROMUCOSAL | Status: DC | PRN
  Filled 2021-01-25: qty 177

## 2021-01-25 MED ORDER — PROPOFOL 1000 MG/100ML IV EMUL
INTRAVENOUS | Status: AC
  Filled 2021-01-25: qty 100

## 2021-01-25 MED ORDER — CEFAZOLIN SODIUM 1 G IJ SOLR
INTRAMUSCULAR | Status: AC
  Filled 2021-01-25: qty 20

## 2021-01-25 MED ORDER — ONDANSETRON HCL 4 MG/2ML IJ SOLN
INTRAMUSCULAR | Status: AC
  Filled 2021-01-25: qty 2

## 2021-01-25 MED ORDER — MIDAZOLAM HCL 5 MG/5ML IJ SOLN
INTRAMUSCULAR | Status: DC | PRN
  Administered 2021-01-25 (×2): 2 mg via INTRAVENOUS

## 2021-01-25 MED ORDER — PROPOFOL 10 MG/ML IV BOLUS
INTRAVENOUS | Status: DC | PRN
  Administered 2021-01-25: 20 mg via INTRAVENOUS
  Administered 2021-01-25: 80 mg via INTRAVENOUS

## 2021-01-25 MED ORDER — BISACODYL 10 MG RE SUPP
10.0000 mg | Freq: Every day | RECTAL | Status: DC | PRN
  Administered 2021-01-27: 10 mg via RECTAL
  Filled 2021-01-25: qty 1

## 2021-01-25 MED ORDER — MIDAZOLAM HCL 2 MG/2ML IJ SOLN
INTRAMUSCULAR | Status: AC
  Filled 2021-01-25: qty 2

## 2021-01-25 MED ORDER — GABAPENTIN 300 MG PO CAPS: 300.0000 mg | ORAL_CAPSULE | Freq: Once | ORAL | Status: AC

## 2021-01-25 MED ORDER — ORAL CARE MOUTH RINSE: 15.0000 mL | Freq: Once | OROMUCOSAL | Status: AC

## 2021-01-25 MED ORDER — ENOXAPARIN SODIUM 30 MG/0.3ML IJ SOSY
30.0000 mg | PREFILLED_SYRINGE | Freq: Two times a day (BID) | INTRAMUSCULAR | Status: DC
  Administered 2021-01-26 – 2021-01-28 (×5): 30 mg via SUBCUTANEOUS
  Filled 2021-01-25 (×5): qty 0.3

## 2021-01-25 MED ORDER — OXYCODONE HCL 5 MG PO TABS
5.0000 mg | ORAL_TABLET | ORAL | Status: DC | PRN
  Administered 2021-01-26 (×4): 5 mg via ORAL
  Filled 2021-01-25 (×4): qty 1

## 2021-01-25 MED ORDER — ENALAPRIL MALEATE 10 MG PO TABS
10.0000 mg | ORAL_TABLET | ORAL | Status: DC
  Administered 2021-01-26 – 2021-01-28 (×3): 10 mg via ORAL
  Filled 2021-01-25 (×3): qty 1

## 2021-01-25 MED ORDER — INSULIN ASPART 100 UNIT/ML IJ SOLN
0.0000 [IU] | Freq: Every day | INTRAMUSCULAR | Status: DC
  Administered 2021-01-25: 2 [IU] via SUBCUTANEOUS
  Filled 2021-01-25: qty 1

## 2021-01-25 MED ORDER — KETAMINE HCL 50 MG/ML IJ SOLN
INTRAMUSCULAR | Status: DC | PRN
  Administered 2021-01-25: 10 mg via INTRAVENOUS
  Administered 2021-01-25: 20 mg via INTRAVENOUS
  Administered 2021-01-25 (×2): 10 mg via INTRAVENOUS

## 2021-01-25 MED ORDER — BUPIVACAINE HCL (PF) 0.5 % IJ SOLN
INTRAMUSCULAR | Status: AC
  Filled 2021-01-25: qty 10

## 2021-01-25 MED ORDER — ACETAMINOPHEN 325 MG PO TABS
325.0000 mg | ORAL_TABLET | Freq: Four times a day (QID) | ORAL | Status: DC | PRN
  Administered 2021-01-27: 650 mg via ORAL
  Filled 2021-01-25: qty 2

## 2021-01-25 MED ORDER — DEXMEDETOMIDINE (PRECEDEX) IN NS 20 MCG/5ML (4 MCG/ML) IV SYRINGE
PREFILLED_SYRINGE | INTRAVENOUS | Status: DC | PRN
  Administered 2021-01-25 (×4): 4 ug via INTRAVENOUS
  Administered 2021-01-25: 8 ug via INTRAVENOUS
  Administered 2021-01-25: 12 ug via INTRAVENOUS

## 2021-01-25 MED ORDER — PROPOFOL 500 MG/50ML IV EMUL
INTRAVENOUS | Status: DC | PRN
  Administered 2021-01-25: 50 ug/kg/min via INTRAVENOUS

## 2021-01-25 MED ORDER — INSULIN ASPART 100 UNIT/ML IJ SOLN
0.0000 [IU] | Freq: Three times a day (TID) | INTRAMUSCULAR | Status: DC
  Administered 2021-01-26 – 2021-01-28 (×6): 2 [IU] via SUBCUTANEOUS
  Filled 2021-01-25 (×5): qty 1

## 2021-01-25 MED ORDER — SENNOSIDES-DOCUSATE SODIUM 8.6-50 MG PO TABS
1.0000 | ORAL_TABLET | Freq: Two times a day (BID) | ORAL | Status: DC
  Administered 2021-01-25 – 2021-01-28 (×6): 1 via ORAL
  Filled 2021-01-25 (×6): qty 1

## 2021-01-25 MED ORDER — BUPIVACAINE HCL (PF) 0.5 % IJ SOLN
INTRAMUSCULAR | Status: DC | PRN
Start: 2021-01-25 — End: 2021-01-25
  Administered 2021-01-25: 3 mL via INTRATHECAL

## 2021-01-25 MED ORDER — CELECOXIB 200 MG PO CAPS
ORAL_CAPSULE | ORAL | Status: AC
  Administered 2021-01-25: 400 mg via ORAL
  Filled 2021-01-25: qty 2

## 2021-01-25 MED ORDER — GABAPENTIN 300 MG PO CAPS
ORAL_CAPSULE | ORAL | Status: AC
  Administered 2021-01-25: 300 mg via ORAL
  Filled 2021-01-25: qty 1

## 2021-01-25 MED ORDER — DIPHENHYDRAMINE HCL 12.5 MG/5ML PO ELIX: 12.5000 mg | ORAL_SOLUTION | ORAL | Status: DC | PRN

## 2021-01-25 MED ORDER — LIDOCAINE HCL (PF) 2 % IJ SOLN
INTRAMUSCULAR | Status: AC
  Filled 2021-01-25: qty 5

## 2021-01-25 MED ORDER — SODIUM CHLORIDE 0.9 % IR SOLN
Status: DC | PRN
  Administered 2021-01-25: 3000 mL

## 2021-01-25 MED ORDER — FLEET ENEMA 7-19 GM/118ML RE ENEM: 1.0000 | ENEMA | Freq: Once | RECTAL | Status: DC | PRN

## 2021-01-25 MED ORDER — DEXTROSE 5 % IV SOLN
INTRAVENOUS | Status: DC | PRN
  Administered 2021-01-25: 3 g via INTRAVENOUS
  Administered 2021-01-25: 2 g via INTRAVENOUS

## 2021-01-25 MED ORDER — ONDANSETRON HCL 4 MG/2ML IJ SOLN
4.0000 mg | Freq: Four times a day (QID) | INTRAMUSCULAR | Status: DC | PRN
  Administered 2021-01-26: 4 mg via INTRAVENOUS

## 2021-01-25 MED ORDER — KETAMINE HCL 50 MG/5ML IJ SOSY
PREFILLED_SYRINGE | INTRAMUSCULAR | Status: AC
  Filled 2021-01-25: qty 5

## 2021-01-25 MED ORDER — ALUM & MAG HYDROXIDE-SIMETH 200-200-20 MG/5ML PO SUSP: 30.0000 mL | ORAL | Status: DC | PRN

## 2021-01-25 MED ORDER — BUPIVACAINE LIPOSOME 1.3 % IJ SUSP
INTRAMUSCULAR | Status: AC
  Filled 2021-01-25: qty 20

## 2021-01-25 MED ORDER — DEXAMETHASONE SODIUM PHOSPHATE 10 MG/ML IJ SOLN
INTRAMUSCULAR | Status: AC
  Administered 2021-01-25: 8 mg via INTRAVENOUS
  Filled 2021-01-25: qty 1

## 2021-01-25 MED ORDER — MAGNESIUM HYDROXIDE 400 MG/5ML PO SUSP
30.0000 mL | Freq: Every day | ORAL | Status: DC
  Administered 2021-01-26 – 2021-01-28 (×3): 30 mL via ORAL
  Filled 2021-01-25 (×3): qty 30

## 2021-01-25 MED ORDER — TRANEXAMIC ACID-NACL 1000-0.7 MG/100ML-% IV SOLN
1000.0000 mg | Freq: Once | INTRAVENOUS | Status: AC
  Filled 2021-01-25 (×2): qty 100

## 2021-01-25 MED ORDER — CELECOXIB 200 MG PO CAPS: 400.0000 mg | ORAL_CAPSULE | Freq: Once | ORAL | Status: AC

## 2021-01-25 MED ORDER — ACETAMINOPHEN 10 MG/ML IV SOLN
1000.0000 mg | Freq: Four times a day (QID) | INTRAVENOUS | Status: AC
  Administered 2021-01-26 (×4): 1000 mg via INTRAVENOUS
  Filled 2021-01-25 (×4): qty 100

## 2021-01-25 MED ORDER — FENTANYL CITRATE (PF) 100 MCG/2ML IJ SOLN
INTRAMUSCULAR | Status: DC | PRN
  Administered 2021-01-25 (×4): 25 ug via INTRAVENOUS
  Administered 2021-01-25: 50 ug via INTRAVENOUS
  Administered 2021-01-25 (×2): 25 ug via INTRAVENOUS
  Administered 2021-01-25 (×2): 50 ug via INTRAVENOUS

## 2021-01-25 MED ORDER — TRANEXAMIC ACID-NACL 1000-0.7 MG/100ML-% IV SOLN
1000.0000 mg | INTRAVENOUS | Status: AC
  Administered 2021-01-25: 1000 mg via INTRAVENOUS

## 2021-01-25 MED ORDER — BUPIVACAINE HCL (PF) 0.25 % IJ SOLN
INTRAMUSCULAR | Status: AC
  Filled 2021-01-25: qty 60

## 2021-01-25 MED ORDER — SODIUM CHLORIDE 0.9 % IV SOLN: INTRAVENOUS | Status: DC

## 2021-01-25 MED ORDER — PHENYLEPHRINE HCL (PRESSORS) 10 MG/ML IV SOLN
INTRAVENOUS | Status: DC | PRN
  Administered 2021-01-25: 100 ug via INTRAVENOUS
  Administered 2021-01-25: 200 ug via INTRAVENOUS

## 2021-01-25 MED ORDER — TRAMADOL HCL 50 MG PO TABS
50.0000 mg | ORAL_TABLET | ORAL | Status: DC | PRN
  Administered 2021-01-26: 100 mg via ORAL
  Administered 2021-01-27: 50 mg via ORAL
  Administered 2021-01-27: 100 mg via ORAL
  Filled 2021-01-25: qty 1
  Filled 2021-01-25 (×2): qty 2

## 2021-01-25 MED ORDER — MENTHOL 3 MG MT LOZG
1.0000 | LOZENGE | OROMUCOSAL | Status: DC | PRN
  Filled 2021-01-25: qty 9

## 2021-01-25 MED ORDER — GLYCOPYRROLATE 0.2 MG/ML IJ SOLN
INTRAMUSCULAR | Status: DC | PRN
  Administered 2021-01-25: .2 mg via INTRAVENOUS

## 2021-01-25 MED ORDER — DEXAMETHASONE SODIUM PHOSPHATE 10 MG/ML IJ SOLN: 8.0000 mg | Freq: Once | INTRAMUSCULAR | Status: AC

## 2021-01-25 MED ORDER — CHLORHEXIDINE GLUCONATE 0.12 % MT SOLN
OROMUCOSAL | Status: AC
  Administered 2021-01-25: 15 mL via OROMUCOSAL
  Filled 2021-01-25: qty 15

## 2021-01-25 MED ORDER — CHLORHEXIDINE GLUCONATE 4 % EX LIQD: 60.0000 mL | Freq: Once | CUTANEOUS | Status: DC

## 2021-01-25 MED ORDER — METFORMIN HCL 500 MG PO TABS
1000.0000 mg | ORAL_TABLET | Freq: Two times a day (BID) | ORAL | Status: DC
  Administered 2021-01-26 – 2021-01-28 (×5): 1000 mg via ORAL
  Filled 2021-01-25 (×5): qty 2

## 2021-01-25 MED ORDER — DEXMEDETOMIDINE (PRECEDEX) IN NS 20 MCG/5ML (4 MCG/ML) IV SYRINGE
PREFILLED_SYRINGE | INTRAVENOUS | Status: AC
  Filled 2021-01-25: qty 5

## 2021-01-25 MED ORDER — ACETAMINOPHEN 10 MG/ML IV SOLN
INTRAVENOUS | Status: DC | PRN
Start: 2021-01-25 — End: 2021-01-25
  Administered 2021-01-25: 1000 mg via INTRAVENOUS

## 2021-01-25 MED ORDER — TRANEXAMIC ACID-NACL 1000-0.7 MG/100ML-% IV SOLN
INTRAVENOUS | Status: AC
  Administered 2021-01-25: 1000 mg via INTRAVENOUS
  Filled 2021-01-25: qty 100

## 2021-01-25 MED ORDER — SODIUM CHLORIDE (PF) 0.9 % IJ SOLN
INTRAMUSCULAR | Status: DC | PRN
  Administered 2021-01-25: 120 mL

## 2021-01-25 MED ORDER — SODIUM CHLORIDE 0.9 % IV SOLN: INTRAVENOUS | Status: DC | PRN

## 2021-01-25 MED ORDER — FENTANYL CITRATE (PF) 100 MCG/2ML IJ SOLN
INTRAMUSCULAR | Status: AC
  Filled 2021-01-25: qty 2

## 2021-01-25 MED ORDER — FERROUS SULFATE 325 (65 FE) MG PO TABS
325.0000 mg | ORAL_TABLET | Freq: Two times a day (BID) | ORAL | Status: DC
  Administered 2021-01-26 – 2021-01-28 (×5): 325 mg via ORAL
  Filled 2021-01-25 (×5): qty 1

## 2021-01-25 MED ORDER — ACETAMINOPHEN 10 MG/ML IV SOLN
INTRAVENOUS | Status: AC
  Filled 2021-01-25: qty 100

## 2021-01-25 MED ORDER — SODIUM CHLORIDE FLUSH 0.9 % IV SOLN
INTRAVENOUS | Status: AC
  Filled 2021-01-25: qty 40

## 2021-01-25 MED ORDER — SURGIPHOR WOUND IRRIGATION SYSTEM - OPTIME
TOPICAL | Status: DC | PRN
  Administered 2021-01-25: 1 via TOPICAL

## 2021-01-25 MED ORDER — CEFAZOLIN IN SODIUM CHLORIDE 3-0.9 GM/100ML-% IV SOLN
3.0000 g | INTRAVENOUS | Status: DC
  Filled 2021-01-25: qty 100

## 2021-01-25 MED ORDER — GLYCOPYRROLATE 0.2 MG/ML IJ SOLN
INTRAMUSCULAR | Status: AC
  Filled 2021-01-25: qty 1

## 2021-01-25 MED ORDER — HYDROMORPHONE HCL 1 MG/ML IJ SOLN: 0.5000 mg | INTRAMUSCULAR | Status: DC | PRN

## 2021-01-25 MED ORDER — ENSURE PRE-SURGERY PO LIQD
296.0000 mL | Freq: Once | ORAL | Status: DC
  Filled 2021-01-25: qty 296

## 2021-01-25 MED ORDER — PANTOPRAZOLE SODIUM 40 MG PO TBEC
40.0000 mg | DELAYED_RELEASE_TABLET | Freq: Two times a day (BID) | ORAL | Status: DC
  Administered 2021-01-26 – 2021-01-28 (×5): 40 mg via ORAL
  Filled 2021-01-25 (×5): qty 1

## 2021-01-25 MED ORDER — LORATADINE 10 MG PO TABS
10.0000 mg | ORAL_TABLET | Freq: Every day | ORAL | Status: DC
  Administered 2021-01-26 – 2021-01-27 (×2): 10 mg via ORAL
  Filled 2021-01-25 (×2): qty 1

## 2021-01-25 MED ORDER — STERILE WATER FOR IRRIGATION IR SOLN
Status: DC | PRN
  Administered 2021-01-25: 1000 mL

## 2021-01-25 MED ORDER — ONDANSETRON HCL 4 MG PO TABS
4.0000 mg | ORAL_TABLET | Freq: Four times a day (QID) | ORAL | Status: DC | PRN
  Administered 2021-01-26: 4 mg via ORAL
  Filled 2021-01-25 (×2): qty 1

## 2021-01-25 MED ORDER — VITAMIN D 25 MCG (1000 UNIT) PO TABS
5000.0000 [IU] | ORAL_TABLET | Freq: Every day | ORAL | Status: DC
  Administered 2021-01-26 – 2021-01-28 (×3): 5000 [IU] via ORAL
  Filled 2021-01-25 (×3): qty 5

## 2021-01-25 MED ORDER — CEFAZOLIN SODIUM-DEXTROSE 2-4 GM/100ML-% IV SOLN
2.0000 g | Freq: Four times a day (QID) | INTRAVENOUS | Status: AC
  Administered 2021-01-25 – 2021-01-26 (×2): 2 g via INTRAVENOUS
  Filled 2021-01-25 (×2): qty 100

## 2021-01-25 MED ORDER — CELECOXIB 200 MG PO CAPS
200.0000 mg | ORAL_CAPSULE | Freq: Two times a day (BID) | ORAL | Status: DC
  Administered 2021-01-25 – 2021-01-28 (×6): 200 mg via ORAL
  Filled 2021-01-25 (×6): qty 1

## 2021-01-25 MED ORDER — OXYCODONE HCL 5 MG PO TABS
10.0000 mg | ORAL_TABLET | ORAL | Status: DC | PRN
  Administered 2021-01-27: 10 mg via ORAL
  Filled 2021-01-25: qty 2

## 2021-01-25 MED ORDER — MONTELUKAST SODIUM 10 MG PO TABS
10.0000 mg | ORAL_TABLET | Freq: Every evening | ORAL | Status: DC
  Administered 2021-01-25 – 2021-01-27 (×3): 10 mg via ORAL
  Filled 2021-01-25 (×3): qty 1

## 2021-01-25 MED ORDER — ONDANSETRON HCL 4 MG/2ML IJ SOLN
INTRAMUSCULAR | Status: DC | PRN
  Administered 2021-01-25: 4 mg via INTRAVENOUS

## 2021-01-25 SURGICAL SUPPLY — 77 items
ATTUNE MED DOME PAT 38 KNEE (Knees) ×1 IMPLANT
ATTUNE PS FEM RT SZ 7 CEM KNEE (Femur) ×1 IMPLANT
ATTUNE PSRP INSR SZ7 5 KNEE (Insert) ×1 IMPLANT
BASE TIBIA ATTUNE KNEE SYS SZ6 (Knees) IMPLANT
BATTERY INSTRU NAVIGATION (MISCELLANEOUS) ×8 IMPLANT
BLADE SAW 70X12.5 (BLADE) ×2 IMPLANT
BLADE SAW 90X13X1.19 OSCILLAT (BLADE) ×2 IMPLANT
BLADE SAW 90X25X1.19 OSCILLAT (BLADE) ×2 IMPLANT
BONE CEMENT GENTAMICIN (Cement) ×4 IMPLANT
CEMENT BONE GENTAMICIN 40 (Cement) IMPLANT
COOLER POLAR GLACIER W/PUMP (MISCELLANEOUS) ×2 IMPLANT
CUFF TOURN SGL QUICK 24 (TOURNIQUET CUFF)
CUFF TOURN SGL QUICK 34 (TOURNIQUET CUFF)
CUFF TRNQT CYL 24X4X16.5-23 (TOURNIQUET CUFF) IMPLANT
CUFF TRNQT CYL 34X4.125X (TOURNIQUET CUFF) IMPLANT
DRAPE 3/4 80X56 (DRAPES) ×2 IMPLANT
DRAPE INCISE IOBAN 66X45 STRL (DRAPES) ×2 IMPLANT
DRSG DERMACEA 8X12 NADH (GAUZE/BANDAGES/DRESSINGS) ×2 IMPLANT
DRSG MEPILEX SACRM 8.7X9.8 (GAUZE/BANDAGES/DRESSINGS) ×2 IMPLANT
DRSG OPSITE POSTOP 4X14 (GAUZE/BANDAGES/DRESSINGS) ×2 IMPLANT
DRSG TEGADERM 4X4.75 (GAUZE/BANDAGES/DRESSINGS) ×2 IMPLANT
DURAPREP 26ML APPLICATOR (WOUND CARE) ×4 IMPLANT
ELECT CAUTERY BLADE 6.4 (BLADE) ×1 IMPLANT
ELECT REM PT RETURN 9FT ADLT (ELECTROSURGICAL) ×2
ELECTRODE REM PT RTRN 9FT ADLT (ELECTROSURGICAL) ×1 IMPLANT
EX-PIN ORTHOLOCK NAV 4X150 (PIN) ×4 IMPLANT
GAUZE 4X4 16PLY ~~LOC~~+RFID DBL (SPONGE) IMPLANT
GLOVE SURG ENC TEXT LTX SZ7.5 (GLOVE) ×4 IMPLANT
GLOVE SURG UNDER POLY LF SZ7.5 (GLOVE) ×4 IMPLANT
GOWN STRL REUS W/ TWL LRG LVL3 (GOWN DISPOSABLE) ×2 IMPLANT
GOWN STRL REUS W/ TWL XL LVL3 (GOWN DISPOSABLE) ×1 IMPLANT
GOWN STRL REUS W/TWL LRG LVL3 (GOWN DISPOSABLE) ×2
GOWN STRL REUS W/TWL XL LVL3 (GOWN DISPOSABLE) ×1
HEMOVAC 400CC 10FR (MISCELLANEOUS) ×2 IMPLANT
HOLDER FOLEY CATH W/STRAP (MISCELLANEOUS) ×2 IMPLANT
IRRIGATION SURGIPHOR STRL (IV SOLUTION) ×2 IMPLANT
IV NS IRRIG 3000ML ARTHROMATIC (IV SOLUTION) ×2 IMPLANT
KIT TURNOVER KIT A (KITS) ×2 IMPLANT
KNIFE SCULPS 14X20 (INSTRUMENTS) ×2 IMPLANT
LABEL OR SOLS (LABEL) ×1 IMPLANT
MANIFOLD NEPTUNE II (INSTRUMENTS) ×4 IMPLANT
NAVIGATION PIN 4X100 (PIN)
NDL SAFETY ECLIPSE 18X1.5 (NEEDLE) ×1 IMPLANT
NDL SPNL 20GX3.5 QUINCKE YW (NEEDLE) ×2 IMPLANT
NEEDLE HYPO 18GX1.5 SHARP (NEEDLE)
NEEDLE SPNL 20GX3.5 QUINCKE YW (NEEDLE) ×4 IMPLANT
NS IRRIG 500ML POUR BTL (IV SOLUTION) ×1 IMPLANT
PACK TOTAL KNEE (MISCELLANEOUS) ×2 IMPLANT
PAD ABD DERMACEA PRESS 5X9 (GAUZE/BANDAGES/DRESSINGS) ×4 IMPLANT
PAD WRAPON POLAR KNEE (MISCELLANEOUS) ×1 IMPLANT
PENCIL SMOKE EVACUATOR COATED (MISCELLANEOUS) ×2 IMPLANT
PIN DRILL FIX HALF THREAD (BIT) ×4 IMPLANT
PIN FIXATION 1/8DIA X 3INL (PIN) ×2 IMPLANT
PIN NAVIGATION 4X100 (PIN) IMPLANT
PULSAVAC PLUS IRRIG FAN TIP (DISPOSABLE) ×2
SOL PREP PVP 2OZ (MISCELLANEOUS) ×2
SOLUTION PREP PVP 2OZ (MISCELLANEOUS) ×1 IMPLANT
SPONGE DRAIN TRACH 4X4 STRL 2S (GAUZE/BANDAGES/DRESSINGS) ×2 IMPLANT
SPONGE T-LAP 18X18 ~~LOC~~+RFID (SPONGE) ×6 IMPLANT
STAPLER SKIN PROX 35W (STAPLE) ×2 IMPLANT
STOCKINETTE IMPERV 14X48 (MISCELLANEOUS) ×1 IMPLANT
STRAP TIBIA SHORT (MISCELLANEOUS) ×2 IMPLANT
SUCTION FRAZIER HANDLE 10FR (MISCELLANEOUS) ×2
SUCTION TUBE FRAZIER 10FR DISP (MISCELLANEOUS) ×1 IMPLANT
SUT VIC AB 0 CT1 36 (SUTURE) ×3 IMPLANT
SUT VIC AB 1 CT1 36 (SUTURE) ×4 IMPLANT
SUT VIC AB 2-0 CT2 27 (SUTURE) ×2 IMPLANT
SYR 20ML LL LF (SYRINGE) ×1 IMPLANT
SYR 30ML LL (SYRINGE) ×4 IMPLANT
TIBIA ATTUNE KNEE SYS BASE SZ6 (Knees) ×2 IMPLANT
TIP FAN IRRIG PULSAVAC PLUS (DISPOSABLE) ×1 IMPLANT
TOWEL OR 17X26 4PK STRL BLUE (TOWEL DISPOSABLE) ×1 IMPLANT
TOWER CARTRIDGE SMART MIX (DISPOSABLE) ×2 IMPLANT
TRAY FOLEY MTR SLVR 16FR STAT (SET/KITS/TRAYS/PACK) ×2 IMPLANT
WATER STERILE IRR 1000ML POUR (IV SOLUTION) ×1 IMPLANT
WATER STERILE IRR 500ML POUR (IV SOLUTION) ×1 IMPLANT
WRAPON POLAR PAD KNEE (MISCELLANEOUS) ×2

## 2021-01-25 NOTE — Transfer of Care (Signed)
Immediate Anesthesia Transfer of Care Note  Patient: Krystal Rodriguez  Procedure(s) Performed: COMPUTER ASSISTED TOTAL KNEE ARTHROPLASTY (Right: Knee)  Patient Location: PACU  Anesthesia Type:General  Level of Consciousness: drowsy and patient cooperative  Airway & Oxygen Therapy: Patient Spontanous Breathing and Patient connected to face mask oxygen  Post-op Assessment: Report given to RN and Post -op Vital signs reviewed and stable  Post vital signs: Reviewed and stable  Last Vitals:  Vitals Value Taken Time  BP 125/53 01/25/21 1931  Temp    Pulse 109 01/25/21 1931  Resp 0 01/25/21 1931  SpO2 94 % 01/25/21 1931  Vitals shown include unvalidated device data.  Last Pain:  Vitals:   01/25/21 1158  TempSrc: Oral  PainSc: 2          Complications: No notable events documented.

## 2021-01-25 NOTE — Op Note (Signed)
OPERATIVE NOTE  DATE OF SURGERY:  01/25/2021  PATIENT NAME:  MARCEIL WELP   DOB: 11-20-1950  MRN: [id number]  PRE-OPERATIVE DIAGNOSIS: Degenerative arthrosis of the right knee, primary  POST-OPERATIVE DIAGNOSIS:  Same  PROCEDURE:  Right total knee arthroplasty using computer-assisted navigation  SURGEON:  Jena Gauss. M.D.  ASSISTANT: Baldwin Jamaica, PA-C (present and scrubbed throughout the case, critical for assistance with exposure, retraction, instrumentation, and closure)  ANESTHESIA: spinal and general  ESTIMATED BLOOD LOSS: 50 mL  FLUIDS REPLACED: 2000 mL of crystalloid  TOURNIQUET TIME: #1 - 120 minutes #2 - 25 minutes  DRAINS: 2 medium Hemovac drains  SOFT TISSUE RELEASES: Anterior cruciate ligament, posterior cruciate ligament, deep and superficial medial collateral ligament, patellofemoral ligament  IMPLANTS UTILIZED: DePuy Attune size 7 posterior stabilized femoral component (cemented), size 6 rotating platform tibial component (cemented), 38 mm medialized dome patella (cemented), and a 5 mm stabilized rotating platform polyethylene insert.  INDICATIONS FOR SURGERY: ANNE-MARIE GENSON is a 70 y.o. year old female with a long history of progressive knee pain. X-rays demonstrated severe degenerative changes in tricompartmental fashion. The patient had not seen any significant improvement despite conservative nonsurgical intervention. After discussion of the risks and benefits of surgical intervention, the patient expressed understanding of the risks benefits and agree with plans for total knee arthroplasty.   The risks, benefits, and alternatives were discussed at length including but not limited to the risks of infection, bleeding, nerve injury, stiffness, blood clots, the need for revision surgery, cardiopulmonary complications, among others, and they were willing to proceed.  PROCEDURE IN DETAIL: The patient was brought into the operating room and, after adequate spinal and  general anesthesia was achieved, a tourniquet was placed on the patient's upper thigh. The patient's knee and leg were cleaned and prepped with alcohol and DuraPrep and draped in the usual sterile fashion. A "timeout" was performed as per usual protocol. The lower extremity was exsanguinated using an Esmarch, and the tourniquet was inflated to 300 mmHg. An anterior longitudinal incision was made followed by a standard mid vastus approach. The deep fibers of the medial collateral ligament were elevated in a subperiosteal fashion off of the medial flare of the tibia so as to maintain a continuous soft tissue sleeve. The patella was subluxed laterally and the patellofemoral ligament was incised. Inspection of the knee demonstrated severe degenerative changes with full-thickness loss of articular cartilage. Osteophytes were debrided using a rongeur. Anterior and posterior cruciate ligaments were excised. Two 4.0 mm Schanz pins were inserted in the femur and into the tibia for attachment of the array of trackers used for computer-assisted navigation. Hip center was identified using a circumduction technique. Distal landmarks were mapped using the computer. The distal femur and proximal tibia were mapped using the computer. The distal femoral cutting guide was positioned using computer-assisted navigation so as to achieve a 5 distal valgus cut. The femur was sized and it was felt that a size 7 femoral component was appropriate. A size 7 femoral cutting guide was positioned and the anterior cut was performed and verified using the computer. This was followed by completion of the posterior and chamfer cuts. Femoral cutting guide for the central box was then positioned in the center box cut was performed.  Attention was then directed to the proximal tibia. Medial and lateral menisci were excised. The extramedullary tibial cutting guide was positioned using computer-assisted navigation so as to achieve a 0 varus-valgus  alignment and 3 posterior slope. The  cut was performed and verified using the computer. The proximal tibia was sized and it was felt that a size 6 tibial tray was appropriate. Tibial and femoral trials were inserted followed by insertion of a 5 mm polyethylene insert. The knee was felt to be tight medially. A Cobb elevator was used to elevate the superficial fibers of the medial collateral ligament.  This allowed for excellent mediolateral soft tissue balancing both in flexion and in full extension.  The tourniquet was deflated after initial tourniquet time of 120 minutes.  Finally, the patella was cut and prepared so as to accommodate a 38 mm medialized dome patella. A patella trial was placed and the knee was placed through a range of motion with excellent patellar tracking appreciated. The femoral trial was removed after debridement of posterior osteophytes. The central post-hole for the tibial component was reamed followed by insertion of a keel punch. Tibial trials were then removed. Cut surfaces of bone were irrigated with copious amounts of normal saline using pulsatile lavage and then suctioned dry. Polymethylmethacrylate cement with gentamicin was prepared in the usual fashion using a vacuum mixer. Cement was applied to the cut surface of the proximal tibia as well as along the undersurface of a size 6 rotating platform tibial component. Tibial component was positioned and impacted into place. Excess cement was removed using Personal assistant. Cement was then applied to the cut surfaces of the femur as well as along the posterior flanges of the size 7 femoral component. The femoral component was positioned and impacted into place. Excess cement was removed using Personal assistant. A 5 mm polyethylene trial was inserted and the knee was brought into full extension with steady axial compression applied. Finally, cement was applied to the backside of a 38 mm medialized dome patella and the patellar component was  positioned and patellar clamp applied. Excess cement was removed using Personal assistant. After adequate curing of the cement, the tourniquet was deflated after a second tourniquet time of 25 minutes. Hemostasis was achieved using electrocautery. The knee was irrigated with copious amounts of normal saline using pulsatile lavage followed by 500 ml of Surgiphor and then suctioned dry. 20 mL of 1.3% Exparel and 60 mL of 0.25% Marcaine in 40 mL of normal saline was injected along the posterior capsule, medial and lateral gutters, and along the arthrotomy site. A 5 mm stabilized rotating platform polyethylene insert was inserted and the knee was placed through a range of motion with excellent mediolateral soft tissue balancing appreciated and excellent patellar tracking noted. 2 medium drains were placed in the wound bed and brought out through separate stab incisions. The medial parapatellar portion of the incision was reapproximated using interrupted sutures of #1 Vicryl. Subcutaneous tissue was approximated in layers using first #0 Vicryl followed #2-0 Vicryl. The skin was approximated with skin staples. A sterile dressing was applied.  The patient tolerated the procedure well and was transported to the recovery room in stable condition.    Jabbar Palmero P. Angie Fava., M.D.

## 2021-01-25 NOTE — Anesthesia Procedure Notes (Signed)
Spinal  Patient location during procedure: OR Reason for block: surgical anesthesia Staffing Performed: resident/CRNA  Anesthesiologist: Martha Clan, MD Resident/CRNA: Rolla Plate, CRNA Preanesthetic Checklist Completed: patient identified, IV checked, site marked, risks and benefits discussed, surgical consent, monitors and equipment checked, pre-op evaluation and timeout performed Spinal Block Patient position: sitting Prep: ChloraPrep and site prepped and draped Patient monitoring: heart rate, continuous pulse ox, blood pressure and cardiac monitor Approach: midline Location: L4-5 Injection technique: single-shot Needle Needle type: Whitacre and Introducer  Needle gauge: 24 G Needle length: 9 cm Assessment Events: CSF return Additional Notes Negative paresthesia. Negative blood return. Positive free-flowing CSF. Expiration date of kit checked and confirmed. Patient tolerated procedure well, without complications.

## 2021-01-25 NOTE — H&P (Signed)
The patient has been re-examined, and the chart reviewed, and there have been no interval changes to the documented history and physical.    The risks, benefits, and alternatives have been discussed at length. The patient expressed understanding of the risks benefits and agreed with plans for surgical intervention.  Krystal Rodriguez P. Armel Rabbani, Jr. M.D.    

## 2021-01-25 NOTE — Anesthesia Procedure Notes (Signed)
Procedure Name: LMA Insertion Date/Time: 01/25/2021 3:28 PM Performed by: Mathews Argyle, CRNA Pre-anesthesia Checklist: Patient identified, Patient being monitored, Timeout performed, Emergency Drugs available and Suction available Patient Re-evaluated:Patient Re-evaluated prior to induction Oxygen Delivery Method: Circle system utilized Preoxygenation: Pre-oxygenation with 100% oxygen Induction Type: IV induction Ventilation: Mask ventilation without difficulty LMA: LMA inserted LMA Size: 4.0 Tube type: Oral Number of attempts: 1 Placement Confirmation: positive ETCO2 and breath sounds checked- equal and bilateral Tube secured with: Tape Dental Injury: Teeth and Oropharynx as per pre-operative assessment

## 2021-01-26 ENCOUNTER — Encounter: Payer: Self-pay | Admitting: Orthopedic Surgery

## 2021-01-26 LAB — GLUCOSE, CAPILLARY
Glucose-Capillary: 141 mg/dL — ABNORMAL HIGH (ref 70–99)
Glucose-Capillary: 153 mg/dL — ABNORMAL HIGH (ref 70–99)
Glucose-Capillary: 143 mg/dL — ABNORMAL HIGH (ref 70–99)
Glucose-Capillary: 137 mg/dL — ABNORMAL HIGH (ref 70–99)
Glucose-Capillary: 156 mg/dL — ABNORMAL HIGH (ref 70–99)

## 2021-01-26 MED ORDER — SODIUM CHLORIDE 0.9 % IV BOLUS
500.0000 mL | Freq: Once | INTRAVENOUS | Status: AC
  Administered 2021-01-26: 500 mL via INTRAVENOUS

## 2021-01-26 NOTE — Progress Notes (Signed)
Patient has not voided on this shift. Bladder scanned x 2. 128 is the highest value. Dr. Rosita Kea notified. Verbal order given for 500 cc NS bolus.

## 2021-01-26 NOTE — Evaluation (Signed)
Occupational Therapy Evaluation Patient Details Name: Krystal Rodriguez MRN: [id number] DOB: 02-22-1951 Today's Date: 01/26/2021   History of Present Illness 70 y/o female here s/p R TKA 9/21. PMHx includes acid reflux, DMII, HLD, HTN, L TKA 04/2008.   Clinical Impression   Pt seen for OT evaluation this date, POD#1 from above surgery. Pt was independent in all ADL prior to surgery, and using a QC for mobility. Pt is eager to return to PLOF with less pain and improved safety and independence. Pt endorsed whooziness after coming back from the bathroom with nurse tech and declined OOB for ADL/mobility assessment and to get more formal orthostatic vitals. Supine BP 164/64. SpO2 on room air in mid 80's. 1L O2 reapplied and with PLB improved to mid-high 80's. 2L O2 up to low 90's. Left on 2L O2 and notified RN. Pt endorsed whooziness was improving with time and with O2. Pt currently requires minimal assist for LB dressing and bathing due to pain and limited AROM of R knee.  Pt reports her spouse is able to provide assist upon return home and was there to assist after her previous knee surgery. Pt instructed in polar care mgt, falls prevention strategies, home/routines modifications, DME/AE for LB bathing and dressing tasks, and compression stocking mgt. Handout provided to support recall and carryover. Pt would benefit from skilled OT services including additional instruction in dressing techniques with or without assistive devices for dressing and bathing skills to support recall and carryover prior to discharge and ultimately to maximize safety, independence, and minimize falls risk and caregiver burden. Do not currently anticipate any OT needs following this hospitalization.        Recommendations for follow up therapy are one component of a multi-disciplinary discharge planning process, led by the attending physician.  Recommendations may be updated based on patient status, additional functional criteria and  insurance authorization.   Follow Up Recommendations  No OT follow up    Equipment Recommendations  3 in 1 bedside commode    Recommendations for Other Services       Precautions / Restrictions Precautions Precautions: Fall;Knee Precaution Booklet Issued: Yes (comment) Restrictions Weight Bearing Restrictions: Yes RLE Weight Bearing: Weight bearing as tolerated      Mobility Bed Mobility               General bed mobility comments: Pt declined 2/2 feeling "whoozy" and just back from the bathroom with nursing tech.    Transfers                      Balance Overall balance assessment: Needs assistance Sitting-balance support: No upper extremity supported Sitting balance-Leahy Scale: Good                                     ADL either performed or assessed with clinical judgement   ADL Overall ADL's : Needs assistance/impaired                                       General ADL Comments: Pt currently requires MIN A for LB ADL to thread clothing over R foot, CGA for ADL transfers, Mod A for compression stockings and polar care mgt. Pt reports spouse will assist at home.     Vision  Perception     Praxis      Pertinent Vitals/Pain Pain Assessment: 0-10 Pain Score: 5  Pain Location: R knee Pain Descriptors / Indicators: Aching Pain Intervention(s): Limited activity within patient's tolerance;Monitored during session;Premedicated before session;Repositioned;Ice applied     Hand Dominance     Extremity/Trunk Assessment Upper Extremity Assessment Upper Extremity Assessment: Overall WFL for tasks assessed   Lower Extremity Assessment Lower Extremity Assessment: RLE deficits/detail RLE Deficits / Details: s/p R TKA       Communication Communication Communication: No difficulties   Cognition Arousal/Alertness: Awake/alert Behavior During Therapy: WFL for tasks assessed/performed Overall Cognitive  Status: Within Functional Limits for tasks assessed                                     General Comments  Pt endorsed whooziness after coming back from the bathroom with nurse tech and declined OOB to get more formal orthostatic vitals. Supine BP 164/64. SpO2 on room air in mid 80's. 1L O2 reapplied and with PLB improved to mid-high 80's. 2L O2 up to low 90's. Left on 2L O2 and notified RN. Pt endorsed whooziness was improving with time and with O2.    Exercises Other Exercises Other Exercises: Pt instructed in home/routines modifications, falls prevention, AE/DME for ADL, polar care mgt, and compression stocking mgt. Handout provided   Shoulder Instructions      Home Living Family/patient expects to be discharged to:: Private residence Living Arrangements: Spouse/significant other;Children Available Help at Discharge: Family;Available 24 hours/day   Home Access: Stairs to enter [employer] of Steps: 2 Entrance Stairs-Rails: Right;Left Home Layout: One level         Bathroom Toilet: Handicapped height     Home Equipment: Walker - 4 wheels;Cane - single point          Prior Functioning/Environment Level of Independence: Independent with assistive device(s)        Comments: Pt ambulates with QC at baseline. Indep with ADL and IADL but endorses difficulty with prolonged standing for grooming at sink, meal prep.        OT Problem List: Decreased strength;Pain;Decreased range of motion;Impaired balance (sitting and/or standing);Decreased knowledge of use of DME or AE      OT Treatment/Interventions: Self-care/ADL training;Therapeutic exercise;Therapeutic activities;DME and/or AE instruction;Patient/family education;Balance training    OT Goals(Current goals can be found in the care plan section) Acute Rehab OT Goals Patient Stated Goal: go home OT Goal Formulation: With patient Time For Goal Achievement: 02/09/21 Potential to Achieve Goals:  Good ADL Goals Pt Will Perform Lower Body Dressing: with modified independence;sit to/from stand Pt Will Transfer to Toilet: with modified independence;ambulating (LRAD PRN, elevated commode) Additional ADL Goal #1: Pt will independently instruct family/caregiver in polar care mgt Additional ADL Goal #2: Pt will independently instruct family/caregiver in compression stocking mgt  OT Frequency: Min 2X/week   Barriers to D/C:            Co-evaluation              AM-PAC OT "6 Clicks" Daily Activity     Outcome Measure Help from another person eating meals?: None Help from another person taking care of personal grooming?: None Help from another person toileting, which includes using toliet, bedpan, or urinal?: A Little Help from another person bathing (including washing, rinsing, drying)?: A Little Help from another person to put on and taking off regular upper  body clothing?: None Help from another person to put on and taking off regular lower body clothing?: A Little 6 Click Score: 21   End of Session Nurse Communication: Other (comment) (BP, whoozy, O2)  Activity Tolerance: Other (comment) (whoozy) Patient left: in bed;with call bell/phone within reach;with bed alarm set  OT Visit Diagnosis: Other abnormalities of gait and mobility (R26.89);Muscle weakness (generalized) (M62.81);Pain Pain - Right/Left: Right Pain - part of body: Knee                Time: 6734-1937 OT Time Calculation (min): 22 min Charges:  OT General Charges $OT Visit: 1 Visit OT Evaluation $OT Eval Moderate Complexity: 1 Mod OT Treatments $Self Care/Home Management : 8-22 mins  Arman Filter., MPH, MS, OTR/L ascom [phone] 01/26/21, 3:19 PM

## 2021-01-26 NOTE — Anesthesia Preprocedure Evaluation (Signed)
Anesthesia Evaluation  Patient identified by MRN, date of birth, ID band Patient awake    Reviewed: Allergy & Precautions, H&P , NPO status , Patient's Chart, lab work & pertinent test results, reviewed documented beta blocker date and time   History of Anesthesia Complications (+) PONV and history of anesthetic complications  Airway Mallampati: III  TM Distance: >3 FB Neck ROM: full    Dental  (+) Dental Advidsory Given, Teeth Intact   Pulmonary neg pulmonary ROS,    Pulmonary exam normal breath sounds clear to auscultation       Cardiovascular Exercise Tolerance: Good hypertension, (-) angina(-) Past MI and (-) Cardiac Stents Normal cardiovascular exam(-) dysrhythmias (-) Valvular Problems/Murmurs Rhythm:regular Rate:Normal     Neuro/Psych negative neurological ROS  negative psych ROS   GI/Hepatic Neg liver ROS, GERD  ,  Endo/Other  diabetesMorbid obesity  Renal/GU negative Renal ROS  negative genitourinary   Musculoskeletal   Abdominal   Peds  Hematology negative hematology ROS (+)   Anesthesia Other Findings Past Medical History: No date: Arthritis 10/2020: COVID-19 No date: DDD (degenerative disc disease), lumbar No date: Diabetes mellitus without complication (HCC) No date: GERD (gastroesophageal reflux disease) No date: Hypertension No date: PONV (postoperative nausea and vomiting)     Comment:  n/v x 1-gallbladder   Reproductive/Obstetrics negative OB ROS                             Anesthesia Physical Anesthesia Plan  ASA: 3  Anesthesia Plan: Spinal   Post-op Pain Management:    Induction: Intravenous  PONV Risk Score and Plan: 4 or greater and TIVA and Propofol infusion  Airway Management Planned: Natural Airway and Simple Face Mask  Additional Equipment:   Intra-op Plan:   Post-operative Plan:   Informed Consent: I have reviewed the patients History and  Physical, chart, labs and discussed the procedure including the risks, benefits and alternatives for the proposed anesthesia with the patient or authorized representative who has indicated his/her understanding and acceptance.     Dental Advisory Given  Plan Discussed with: Anesthesiologist, CRNA and Surgeon  Anesthesia Plan Comments:         Anesthesia Quick Evaluation

## 2021-01-26 NOTE — Progress Notes (Signed)
Met with the patient at the bedside, she was working with PT She has help at home Krystal Rodriguez is not able to accept the patient for [facility] services due to her living out of their service area, [facility] has accepted the patient  She needs a rolling walker, she has other DME at home, she has transportation to the doctor, she can afford her medications

## 2021-01-26 NOTE — Plan of Care (Signed)
S/p right total knee replacement. Pain controlled using scheduled tylenol and prn oxycodone. Patient vomited during the night. Zofran was given. No void this shift. Bladder scan results did not meet the requirement to in/out cath the patient. Dr. Rosita Kea notified. NS bolus administered. Did not mobilize overnight due to patient stating that her leg still felt numb. Bone foam in use. Foot pump in use. Problem: Education: Goal: Knowledge of General Education information will improve Description: Including pain rating scale, medication(s)/side effects and non-pharmacologic comfort measures Outcome: Progressing   Problem: Health Behavior/Discharge Planning: Goal: Ability to manage health-related needs will improve Outcome: Progressing   Problem: Clinical Measurements: Goal: Ability to maintain clinical measurements within normal limits will improve Outcome: Progressing Goal: Will remain free from infection Outcome: Progressing Goal: Diagnostic test results will improve Outcome: Progressing Goal: Respiratory complications will improve Outcome: Progressing Goal: Cardiovascular complication will be avoided Outcome: Progressing   Problem: Activity: Goal: Risk for activity intolerance will decrease Outcome: Progressing   Problem: Nutrition: Goal: Adequate nutrition will be maintained Outcome: Progressing   Problem: Coping: Goal: Level of anxiety will decrease Outcome: Progressing   Problem: Elimination: Goal: Will not experience complications related to bowel motility Outcome: Progressing Goal: Will not experience complications related to urinary retention Outcome: Progressing   Problem: Pain Managment: Goal: General experience of comfort will improve Outcome: Progressing   Problem: Safety: Goal: Ability to remain free from injury will improve Outcome: Progressing   Problem: Skin Integrity: Goal: Risk for impaired skin integrity will decrease Outcome: Progressing   Problem:  Education: Goal: Knowledge of the prescribed therapeutic regimen will improve Outcome: Progressing Goal: Individualized Educational Video(s) Outcome: Progressing   Problem: Activity: Goal: Ability to avoid complications of mobility impairment will improve Outcome: Progressing Goal: Range of joint motion will improve Outcome: Progressing   Problem: Clinical Measurements: Goal: Postoperative complications will be avoided or minimized Outcome: Progressing   Problem: Pain Management: Goal: Pain level will decrease with appropriate interventions Outcome: Progressing   Problem: Skin Integrity: Goal: Will show signs of wound healing Outcome: Progressing   Problem: Education: Goal: Ability to describe self-care measures that may prevent or decrease complications (Diabetes Survival Skills Education) will improve Outcome: Progressing Goal: Individualized Educational Video(s) Outcome: Progressing   Problem: Coping: Goal: Ability to adjust to condition or change in health will improve Outcome: Progressing   Problem: Fluid Volume: Goal: Ability to maintain a balanced intake and output will improve Outcome: Progressing   Problem: Health Behavior/Discharge Planning: Goal: Ability to identify and utilize available resources and services will improve Outcome: Progressing Goal: Ability to manage health-related needs will improve Outcome: Progressing   Problem: Metabolic: Goal: Ability to maintain appropriate glucose levels will improve Outcome: Progressing   Problem: Nutritional: Goal: Maintenance of adequate nutrition will improve Outcome: Progressing Goal: Progress toward achieving an optimal weight will improve Outcome: Progressing   Problem: Skin Integrity: Goal: Risk for impaired skin integrity will decrease Outcome: Progressing   Problem: Tissue Perfusion: Goal: Adequacy of tissue perfusion will improve Outcome: Progressing

## 2021-01-26 NOTE — Progress Notes (Signed)
Physical Therapy Treatment Patient Details Name: Krystal Rodriguez MRN: [id number] DOB: 01/16/1951 Today's Date: 01/26/2021   History of Present Illness 70 y/o female here s/p R TKA 9/21. PMHx includes acid reflux, DMII, HLD, HTN, L TKA 04/2008.    PT Comments    Pt did well with PT session this afternoon.  She reports some minimal nausea, but did not have any severe issues this afternoon.  She was able to tolerate increased exercises and actually managed some against gravity SLRs on her own.  Pt was able to do prolonged bout of ambulation and was highly motivated to do the entire loop, however at ~150 ft she started to stagger and have some fatigue, she was able to gather herself and take a few more steps but ultimately needed to sit and return to room in the recliner.    Recommendations for follow up therapy are one component of a multi-disciplinary discharge planning process, led by the attending physician.  Recommendations may be updated based on patient status, additional functional criteria and insurance authorization.  Follow Up Recommendations  Home health PT;Follow surgeon's recommendation for DC plan and follow-up therapies     Equipment Recommendations  Rolling walker with 5" wheels    Recommendations for Other Services       Precautions / Restrictions Precautions Precautions: Fall;Knee Precaution Booklet Issued: Yes (comment) Precaution Comments: HEP Restrictions Weight Bearing Restrictions: Yes RLE Weight Bearing: Weight bearing as tolerated     Mobility  Bed Mobility Overal bed mobility: Modified Independent             General bed mobility comments: Pt was able to get up to sitting from supine and then back to supine from sitting w/o direct assist.    Transfers Overall transfer level: Needs assistance Equipment used: Rolling walker (2 wheeled) Transfers: Sit to/from Stand Sit to Stand: Min assist         General transfer comment: Reminders again to use  UEs appropriately and set up LEs/AD.  Pt able to rise w/o direct assist.  Ambulation/Gait Ambulation/Gait assistance: Min guard;Min assist Gait Distance (Feet): 160 Feet Assistive device: Rolling walker (2 wheeled)       General Gait Details: Pt able to quickly assume consistent cadence and was not overly reliant on the walker.  She did have some increased fatigue with increased distance and though she did relatively well much of the time she started feeling weak and unsteady and before we were able to complete the loop (the original plan) she needed to stop and sit rather abruptly.  Vitals were appropraite with O2 in the 90s and HR ~100.   Stairs             Wheelchair Mobility    Modified Rankin (Stroke Patients Only)       Balance Overall balance assessment: Needs assistance Sitting-balance support: No upper extremity supported Sitting balance-Leahy Scale: Good     Standing balance support: Bilateral upper extremity supported Standing balance-Leahy Scale: Fair Standing balance comment: statically she was able to easily stand with little, if any UE use, however as she fatigued she was less steady/stable                            Cognition Arousal/Alertness: Awake/alert Behavior During Therapy: WFL for tasks assessed/performed Overall Cognitive Status: Within Functional Limits for tasks assessed  Exercises Total Joint Exercises Ankle Circles/Pumps: AROM;10 reps Quad Sets: Strengthening;10 reps Short Arc Quad: AROM;10 reps Heel Slides: AAROM;10 reps Hip ABduction/ADduction: Strengthening;10 reps Straight Leg Raises: AAROM;AROM;10 reps (Pt able to do AROM for the last 5 reps) Knee Flexion: PROM;5 reps Other Exercises Other Exercises: Pt instructed in home/routines modifications, falls prevention, AE/DME for ADL, polar care mgt, and compression stocking mgt. Handout provided    General Comments  General comments (skin integrity, edema, etc.): Pt endorsed whooziness after coming back from the bathroom with nurse tech and declined OOB to get more formal orthostatic vitals. Supine BP 164/64. SpO2 on room air in mid 80's. 1L O2 reapplied and with PLB improved to mid-high 80's. 2L O2 up to low 90's. Left on 2L O2 and notified RN. Pt endorsed whooziness was improving with time and with O2.      Pertinent Vitals/Pain Pain Assessment: 0-10 Pain Score: 6  Pain Location: R knee Pain Descriptors / Indicators: Aching Pain Intervention(s): Limited activity within patient's tolerance;Monitored during session;Premedicated before session;Repositioned;Ice applied    Home Living Family/patient expects to be discharged to:: Private residence Living Arrangements: Spouse/significant other;Children Available Help at Discharge: Family;Available 24 hours/day   Home Access: Stairs to enter Entrance Stairs-Rails: Right;Left Home Layout: One level Home Equipment: Walker - 4 wheels;Cane - single point      Prior Function Level of Independence: Independent with assistive device(s)      Comments: Pt ambulates with QC at baseline. Indep with ADL and IADL but endorses difficulty with prolonged standing for grooming at sink, meal prep.   PT Goals (current goals can now be found in the care plan section) Acute Rehab PT Goals Patient Stated Goal: go home Progress towards PT goals: Progressing toward goals    Frequency    BID      PT Plan Current plan remains appropriate    Co-evaluation              AM-PAC PT "6 Clicks" Mobility   Outcome Measure  Help needed turning from your back to your side while in a flat bed without using bedrails?: A Little Help needed moving from lying on your back to sitting on the side of a flat bed without using bedrails?: A Little Help needed moving to and from a bed to a chair (including a wheelchair)?: A Little Help needed standing up from a chair using your  arms (e.g., wheelchair or bedside chair)?: A Little Help needed to walk in hospital room?: A Little Help needed climbing 3-5 steps with a railing? : A Lot 6 Click Score: 17    End of Session Equipment Utilized During Treatment: Gait belt Activity Tolerance: Patient tolerated treatment well;Patient limited by fatigue Patient left: with chair alarm set;with call bell/phone within reach;with family/visitor present Nurse Communication: Mobility status PT Visit Diagnosis: Muscle weakness (generalized) (M62.81);Difficulty in walking, not elsewhere classified (R26.2);Pain Pain - Right/Left: Right Pain - part of body: Knee     Time: 1520-1545 PT Time Calculation (min) (ACUTE ONLY): 25 min  Charges:  $Gait Training: 8-22 mins $Therapeutic Exercise: 8-22 mins                     Malachi Pro, DPT 01/26/2021, 5:19 PM

## 2021-01-26 NOTE — Evaluation (Signed)
Physical Therapy Evaluation Patient Details Name: Krystal Rodriguez MRN: [id number] DOB: [birth year] Today's Date: 01/26/2021  History of Present Illness  70 y/o female here s/p R TKA 9/21  Clinical Impression  Pt did well with PT exam, though she did suffer from some nausea and actually had some vomiting.  She was able to do bed mobility and rise to standing w/o direct assist, and was able to ambulate ~50 ft.  She c/o expected pain with ROM tasks, but did breech 70 degrees of flexion after multiple ROM reps.  She did not manage AROM SLRs but did show good quad sets and did not have buckling in R knee with standing/WBing.       Recommendations for follow up therapy are one component of a multi-disciplinary discharge planning process, led by the attending physician.  Recommendations may be updated based on patient status, additional functional criteria and insurance authorization.  Follow Up Recommendations Home health PT;Follow surgeon's recommendation for DC plan and follow-up therapies    Equipment Recommendations  Rolling walker with 5" wheels    Recommendations for Other Services       Precautions / Restrictions Precautions Precautions: Fall;Knee Precaution Booklet Issued: Yes (comment) Precaution Comments: HEP Restrictions Weight Bearing Restrictions: Yes RLE Weight Bearing: Weight bearing as tolerated      Mobility  Bed Mobility Overal bed mobility: Modified Independent             General bed mobility comments: Pt was slow and highly relaint on rails but able to get to sitting EOB w/o phyiscal assist    Transfers Overall transfer level: Needs assistance Equipment used: Rolling walker (2 wheeled) Transfers: Sit to/from Stand Sit to Stand: Min assist         General transfer comment: Pt needing some repeated cuing and assurance that she would do fine but once cued and ready to go she was able to rise without much direct assist - more guidance to insure continued  momentum to upright in walker  Ambulation/Gait Ambulation/Gait assistance: Min guard Gait Distance (Feet): 45 Feet Assistive device: Rolling walker (2 wheeled)       General Gait Details: Pt with some initial hesitancy with full WBing on the R and highly reliant on the walker but became more comfortable with more consistent cadence with increased distance.  Pt started having considerable fatigue and then some nausea (with vomiting), further ambulation deferred.  Stairs            Wheelchair Mobility    Modified Rankin (Stroke Patients Only)       Balance Overall balance assessment: Needs assistance   Sitting balance-Leahy Scale: Good     Standing balance support: Bilateral upper extremity supported Standing balance-Leahy Scale: Fair                               Pertinent Vitals/Pain Pain Assessment: 0-10 Pain Score: 6  Pain Location: R knee    Home Living Family/patient expects to be discharged to:: Private residence Living Arrangements: Spouse/significant other;Children Available Help at Discharge: Family;Available 24 hours/day   Home Access: Stairs to enter Entrance Stairs-Rails: Right;Left Entrance Stairs-Number of Steps: 2 Home Layout: One level Home Equipment: Walker - 4 wheels;Cane - single point      Prior Function Level of Independence: Independent with assistive device(s)               Hand Dominance  Extremity/Trunk Assessment   Upper Extremity Assessment Upper Extremity Assessment: Overall WFL for tasks assessed    Lower Extremity Assessment Lower Extremity Assessment: Overall WFL for tasks assessed (expected post-op weakness)       Communication   Communication: No difficulties  Cognition Arousal/Alertness: Awake/alert Behavior During Therapy: WFL for tasks assessed/performed Overall Cognitive Status: Within Functional Limits for tasks assessed                                         General Comments General comments (skin integrity, edema, etc.): Pt showed good effort despite some initial play that she could not do much.  Nausea and vomiting did limit our session.    Exercises Total Joint Exercises Ankle Circles/Pumps: AROM;10 reps Quad Sets: Strengthening;10 reps Short Arc Quad: AROM;10 reps Heel Slides: AAROM;5 reps (gentle overpressure with lightly reisted leg ext) Hip ABduction/ADduction: Strengthening;10 reps Straight Leg Raises: AAROM;5 reps Knee Flexion: PROM;5 reps Goniometric ROM: 1-73   Assessment/Plan    PT Assessment Patient needs continued PT services  PT Problem List Decreased range of motion;Decreased strength;Decreased activity tolerance;Decreased balance;Decreased mobility;Decreased coordination;Decreased safety awareness;Decreased knowledge of use of DME;Pain       PT Treatment Interventions DME instruction;Gait training;Stair training;Functional mobility training;Therapeutic activities;Therapeutic exercise;Balance training;Neuromuscular re-education;Patient/family education    PT Goals (Current goals can be found in the Care Plan section)  Acute Rehab PT Goals Patient Stated Goal: go home PT Goal Formulation: With patient Time For Goal Achievement: 02/09/21 Potential to Achieve Goals: Good    Frequency BID   Barriers to discharge        Co-evaluation               AM-PAC PT "6 Clicks" Mobility  Outcome Measure Help needed turning from your back to your side while in a flat bed without using bedrails?: A Little Help needed moving from lying on your back to sitting on the side of a flat bed without using bedrails?: A Little Help needed moving to and from a bed to a chair (including a wheelchair)?: A Little Help needed standing up from a chair using your arms (e.g., wheelchair or bedside chair)?: A Little Help needed to walk in hospital room?: A Little Help needed climbing 3-5 steps with a railing? : A Lot 6 Click Score:  17    End of Session Equipment Utilized During Treatment: Gait belt Activity Tolerance: Patient tolerated treatment well;Patient limited by pain;Patient limited by fatigue Patient left: with chair alarm set;with call bell/phone within reach;with family/visitor present;with nursing/sitter in room Nurse Communication: Mobility status PT Visit Diagnosis: Muscle weakness (generalized) (M62.81);Difficulty in walking, not elsewhere classified (R26.2);Pain Pain - Right/Left: Right Pain - part of body: Knee    Time: 2951-8841 PT Time Calculation (min) (ACUTE ONLY): 46 min   Charges:   PT Evaluation $PT Eval Low Complexity: 1 Low PT Treatments $Gait Training: 8-22 mins $Therapeutic Exercise: 8-22 mins        Malachi Pro, DPT 01/26/2021, 10:30 AM

## 2021-01-26 NOTE — Progress Notes (Signed)
  Subjective: 1 Day Post-Op Procedure(s) (LRB): COMPUTER ASSISTED TOTAL KNEE ARTHROPLASTY (Right) Patient reports pain as well-controlled.   Patient is well, but has had some minor complaints of vomiting overnight Plan is to go Home after hospital stay. Negative for chest pain and shortness of breath Fever: no Gastrointestinal: positive for nausea and vomiting.  Patient has not had a bowel movement. Patient does   Objective: Vital signs in last 24 hours: Temp:  [97 F (36.1 C)-99 F (37.2 C)] 98.1 F (36.7 C) (09/22 0738) Pulse Rate:  [80-111] 80 (09/22 0738) Resp:  [0-29] 16 (09/22 0738) BP: (125-160)/(53-89) 148/63 (09/22 0738) SpO2:  [89 %-100 %] 100 % (09/22 0738) Weight:  [128.4 kg] 128.4 kg (09/21 1158)  Intake/Output from previous day:  Intake/Output Summary (Last 24 hours) at 01/26/2021 0821 Last data filed at 01/26/2021 0700 Gross per 24 hour  Intake 2410.22 ml  Output 400 ml  Net 2010.22 ml    Intake/Output this shift: No intake/output data recorded.  Labs: No results for input(s): HGB in the last 72 hours. No results for input(s): WBC, RBC, HCT, PLT in the last 72 hours. No results for input(s): NA, K, CL, CO2, BUN, CREATININE, GLUCOSE, CALCIUM in the last 72 hours. No results for input(s): LABPT, INR in the last 72 hours.   EXAM General - Patient is Alert, Appropriate, and Oriented; patient on nasal canula  Extremity - Neurovascular intact Dorsiflexion/Plantar flexion intact Compartment soft Dressing/Incision -Postoperative dressing remains in place., Polar Care in place and working. , Hemovac in place.  Motor Function - intact, moving foot and toes well on exam.   Able to perform SLR with significant assistance Cardiovascular- Regular rate and rhythm, no murmurs/rubs/gallops Respiratory-  slightly decreased air movement, faint crackles in bilat lower lung fields  Gastrointestinal- soft, nontender, and hypoactive bowel sounds   Assessment/Plan: 1 Day  Post-Op Procedure(s) (LRB): COMPUTER ASSISTED TOTAL KNEE ARTHROPLASTY (Right) Active Problems:   Total knee replacement status  Estimated body mass index is 44.32 kg/m as calculated from the following:   Height as of this encounter: 5\' 7"  (1.702 m).   Weight as of this encounter: 128.4 kg. Advance diet Up with therapy  Incentive spirometer provided to patient. Explained how to use. Bed locked in appropriate position.     DVT Prophylaxis - Lovenox, Ted hose, and foot pumps Weight-Bearing as tolerated to right leg  , PA-C [facility] 01/26/2021, 8:21 AM

## 2021-01-26 NOTE — Plan of Care (Signed)
  Problem: Education: Goal: Knowledge of General Education information will improve Description: Including pain rating scale, medication(s)/side effects and non-pharmacologic comfort measures Outcome: Progressing   Problem: Health Behavior/Discharge Planning: Goal: Ability to manage health-related needs will improve Outcome: Progressing   Problem: Clinical Measurements: Goal: Ability to maintain clinical measurements within normal limits will improve Outcome: Progressing Goal: Will remain free from infection Outcome: Progressing Goal: Diagnostic test results will improve Outcome: Progressing Goal: Respiratory complications will improve Outcome: Progressing Goal: Cardiovascular complication will be avoided Outcome: Progressing   Problem: Activity: Goal: Risk for activity intolerance will decrease Outcome: Progressing   Problem: Nutrition: Goal: Adequate nutrition will be maintained Outcome: Progressing   Problem: Coping: Goal: Level of anxiety will decrease Outcome: Progressing   Problem: Elimination: Goal: Will not experience complications related to bowel motility Outcome: Progressing Goal: Will not experience complications related to urinary retention Outcome: Progressing   Problem: Pain Managment: Goal: General experience of comfort will improve Outcome: Progressing   Problem: Safety: Goal: Ability to remain free from injury will improve Outcome: Progressing   Problem: Skin Integrity: Goal: Risk for impaired skin integrity will decrease Outcome: Progressing   Problem: Education: Goal: Knowledge of the prescribed therapeutic regimen will improve Outcome: Progressing Goal: Individualized Educational Video(s) Outcome: Progressing   Problem: Activity: Goal: Ability to avoid complications of mobility impairment will improve Outcome: Progressing Goal: Range of joint motion will improve Outcome: Progressing   Problem: Clinical Measurements: Goal:  Postoperative complications will be avoided or minimized Outcome: Progressing   Problem: Pain Management: Goal: Pain level will decrease with appropriate interventions Outcome: Progressing   Problem: Skin Integrity: Goal: Will show signs of wound healing Outcome: Progressing   Problem: Education: Goal: Ability to describe self-care measures that may prevent or decrease complications (Diabetes Survival Skills Education) will improve Outcome: Progressing Goal: Individualized Educational Video(s) Outcome: Progressing   Problem: Coping: Goal: Ability to adjust to condition or change in health will improve Outcome: Progressing   Problem: Fluid Volume: Goal: Ability to maintain a balanced intake and output will improve Outcome: Progressing   Problem: Health Behavior/Discharge Planning: Goal: Ability to identify and utilize available resources and services will improve Outcome: Progressing Goal: Ability to manage health-related needs will improve Outcome: Progressing   Problem: Metabolic: Goal: Ability to maintain appropriate glucose levels will improve Outcome: Progressing   Problem: Nutritional: Goal: Maintenance of adequate nutrition will improve Outcome: Progressing Goal: Progress toward achieving an optimal weight will improve Outcome: Progressing   Problem: Skin Integrity: Goal: Risk for impaired skin integrity will decrease Outcome: Progressing   Problem: Tissue Perfusion: Goal: Adequacy of tissue perfusion will improve Outcome: Progressing   

## 2021-01-27 LAB — COMPREHENSIVE METABOLIC PANEL
ALT: 9 U/L (ref 0–44)
AST: 22 U/L (ref 15–41)
Albumin: 3 g/dL — ABNORMAL LOW (ref 3.5–5.0)
Alkaline Phosphatase: 53 U/L (ref 38–126)
Anion gap: 4 — ABNORMAL LOW (ref 5–15)
BUN: 20 mg/dL (ref 8–23)
CO2: 29 mmol/L (ref 22–32)
Calcium: 8.1 mg/dL — ABNORMAL LOW (ref 8.9–10.3)
Chloride: 101 mmol/L (ref 98–111)
Creatinine, Ser: 0.91 mg/dL (ref 0.44–1.00)
GFR, Estimated: >60 mL/min (ref >60)
Glucose, Bld: 149 mg/dL — ABNORMAL HIGH (ref 70–99)
Potassium: 4.9 mmol/L (ref 3.5–5.1)
Sodium: 134 mmol/L — ABNORMAL LOW (ref 135–145)
Total Bilirubin: 0.6 mg/dL (ref 0.3–1.2)
Total Protein: 6.4 g/dL — ABNORMAL LOW (ref 6.5–8.1)

## 2021-01-27 LAB — GLUCOSE, CAPILLARY
Glucose-Capillary: 122 mg/dL — ABNORMAL HIGH (ref 70–99)
Glucose-Capillary: 148 mg/dL — ABNORMAL HIGH (ref 70–99)
Glucose-Capillary: 118 mg/dL — ABNORMAL HIGH (ref 70–99)
Glucose-Capillary: 131 mg/dL — ABNORMAL HIGH (ref 70–99)

## 2021-01-27 LAB — CBC
HCT: 29.5 % — ABNORMAL LOW (ref 36.0–46.0)
Hemoglobin: 9.2 g/dL — ABNORMAL LOW (ref 12.0–15.0)
MCH: 25.2 pg — ABNORMAL LOW (ref 26.0–34.0)
MCHC: 31.2 g/dL (ref 30.0–36.0)
MCV: 80.8 fL (ref 80.0–100.0)
Platelets: 208 10*3/uL (ref 150–400)
RBC: 3.65 MIL/uL — ABNORMAL LOW (ref 3.87–5.11)
RDW: 14.7 % (ref 11.5–15.5)
WBC: 10.4 10*3/uL (ref 4.0–10.5)
nRBC: 0 % (ref 0.0–0.2)

## 2021-01-27 LAB — URINALYSIS, COMPLETE (UACMP) WITH MICROSCOPIC
Bacteria, UA: NONE SEEN
Bilirubin Urine: NEGATIVE
Glucose, UA: NEGATIVE mg/dL
Hgb urine dipstick: NEGATIVE
Ketones, ur: NEGATIVE mg/dL
Leukocytes,Ua: NEGATIVE
Nitrite: NEGATIVE
Protein, ur: NEGATIVE mg/dL
Specific Gravity, Urine: 1.016 (ref 1.005–1.030)
pH: 5 (ref 5.0–8.0)

## 2021-01-27 NOTE — Plan of Care (Signed)
  Problem: Health Behavior/Discharge Planning: Goal: Ability to manage health-related needs will improve Outcome: Progressing   Problem: Safety: Goal: Ability to remain free from injury will improve Outcome: Progressing   Problem: Activity: Goal: Ability to avoid complications of mobility impairment will improve Outcome: Progressing   Problem: Clinical Measurements: Goal: Postoperative complications will be avoided or minimized Outcome: Progressing   Problem: Pain Management: Goal: Pain level will decrease with appropriate interventions Outcome: Progressing

## 2021-01-27 NOTE — Progress Notes (Addendum)
Physical Therapy Treatment Patient Details Name: Krystal Rodriguez MRN: [id number] DOB: [birth year] Today's Date: 01/27/2021   History of Present Illness 70 y/o female here s/p R TKA 9/21. PMHx includes acid reflux, DMII, HLD, HTN, L TKA 04/2008.    PT Comments    Pt up in recliner noting general sx of malaise and  RLE pain 6/10 at rest. Pt on 1L O2 through tx w/ SpO2 88-92%. Pt left on room air per RN request, SpO2 88-89% at rest. Due to not feeling well pt decline ambulation past ambulation to bathroom, but agreeable to seated therapeutic exercises to improve AROM. Discharge recommendations remain HHPT. Skilled PT intervention is indicated to address deficits in function, mobility, and to return to PLOF as able.     Recommendations for follow up therapy are one component of a multi-disciplinary discharge planning process, led by the attending physician.  Recommendations may be updated based on patient status, additional functional criteria and insurance authorization.  Follow Up Recommendations  Home health PT;Follow surgeon's recommendation for DC plan and follow-up therapies     Equipment Recommendations  Rolling walker with 5" wheels    Recommendations for Other Services       Precautions / Restrictions Precautions Precautions: Fall;Knee Restrictions Weight Bearing Restrictions: Yes RLE Weight Bearing: Weight bearing as tolerated     Mobility  Bed Mobility Overal bed mobility: Needs Assistance Bed Mobility: Sit to Supine       Sit to supine: Min assist   General bed mobility comments: pt able to cross BLU at EOB but requires MIN A to bring them up to bed    Transfers Overall transfer level: Needs assistance Equipment used: Rolling walker (2 wheeled) Transfers: Sit to/from Stand Sit to Stand: Min guard         General transfer comment: Cues to keep RW in BOS and reach  Ambulation/Gait Ambulation/Gait assistance: Min guard Gait Distance (Feet): 25 Feet Assistive  device: Rolling walker (2 wheeled) Gait Pattern/deviations: Step-through pattern     General Gait Details: 1 L O2, 90%, increased forward trunk lean w/ increased UE usage on RW, pt declined further mobilization   Stairs Stairs: Yes Stairs assistance: Min guard Stair Management: One rail Left Number of Stairs: 4 General stair comments: 1 L O2 [place]; Relied on BUE support to R hand rail w/ lowered COM for stability; no LOB or instability noted   Wheelchair Mobility    Modified Rankin (Stroke Patients Only)       Balance Overall balance assessment: Needs assistance Sitting-balance support: Bilateral upper extremity supported;Feet supported Sitting balance-Leahy Scale: Good     Standing balance support: Bilateral upper extremity supported;During functional activity Standing balance-Leahy Scale: Fair Standing balance comment: requires BUE support, no signs of instability, as fatigue ensues does not have ability to accept moderate dynmaic challenges                            Cognition Arousal/Alertness:  (fatigued) Behavior During Therapy: WFL for tasks assessed/performed Overall Cognitive Status: Within Functional Limits for tasks assessed                                 General Comments: fatigued      Exercises Total Joint Exercises Ankle Circles/Pumps: AROM;10 reps;Seated Long Arc Quad: AAROM;AROM;10 reps;Right;Seated Knee Flexion: Seated;AAROM;AROM;10 reps;Right Goniometric ROM: 0-62 Other Exercises Other Exercises: Recliner > restroom > bed  min-gaurd RW, pt turns to sit with walker pushed to side, cues for correction for safety Other Exercises: OT facilitated review of instruction and pt able to teach back proper technique for polar care, compression stockings, AE/DME for ADL, and falls prevention.    General Comments General comments (skin integrity, edema, etc.): Pt left on 1 L O2 (RN notified) SpO2 91-92%, no SOB      Pertinent  Vitals/Pain Pain Assessment: 0-10 Pain Score: 6  Pain Location: R knee, thigh Pain Descriptors / Indicators: Aching;Sore;Discomfort Pain Intervention(s): Limited activity within patient's tolerance;Monitored during session;Repositioned;Ice applied    Home Living                      Prior Function            PT Goals (current goals can now be found in the care plan section) Acute Rehab PT Goals Patient Stated Goal: go home Progress towards PT goals: Progressing toward goals    Frequency    BID      PT Plan Current plan remains appropriate    Co-evaluation              AM-PAC PT "6 Clicks" Mobility   Outcome Measure  Help needed turning from your back to your side while in a flat bed without using bedrails?: A Little Help needed moving from lying on your back to sitting on the side of a flat bed without using bedrails?: A Little Help needed moving to and from a bed to a chair (including a wheelchair)?: A Little Help needed standing up from a chair using your arms (e.g., wheelchair or bedside chair)?: A Little Help needed to walk in hospital room?: A Little Help needed climbing 3-5 steps with a railing? : A Lot 6 Click Score: 17    End of Session Equipment Utilized During Treatment: Gait belt;Oxygen Activity Tolerance: Patient limited by fatigue Patient left: in bed;with SCD's reapplied;with call bell/phone within reach;with bed alarm set;with family/visitor present Nurse Communication: Mobility status PT Visit Diagnosis: Muscle weakness (generalized) (M62.81);Difficulty in walking, not elsewhere classified (R26.2);Pain Pain - Right/Left: Right Pain - part of body: Knee     Time: 7681-1572 PT Time Calculation (min) (ACUTE ONLY): 26 min  Charges:  $Gait Training: 23-37 mins $Therapeutic Exercise: 23-37 mins                     [employer], SPT

## 2021-01-27 NOTE — Anesthesia Postprocedure Evaluation (Signed)
Anesthesia Post Note  Patient: Krystal Rodriguez  Procedure(s) Performed: COMPUTER ASSISTED TOTAL KNEE ARTHROPLASTY (Right: Knee)  Patient location during evaluation: Nursing Unit Anesthesia Type: Spinal Level of consciousness: oriented and awake and alert Pain management: pain level controlled Vital Signs Assessment: post-procedure vital signs reviewed and stable Respiratory status: spontaneous breathing Cardiovascular status: blood pressure returned to baseline and stable Postop Assessment: no headache, no backache, no apparent nausea or vomiting and able to ambulate Anesthetic complications: no   No notable events documented.   Last Vitals:  Vitals:   01/26/21 2356 01/27/21 0428  BP: (!) 172/79 (!) 148/61  Pulse: 98 (!) 104  Resp: 17 16  Temp: 36.7 C 36.7 C  SpO2: 91% (!) 86%    Last Pain:  Vitals:   01/27/21 0428  TempSrc: Oral  PainSc:                  Krystal Rodriguez Krystal Rodriguez

## 2021-01-27 NOTE — Progress Notes (Signed)
  Subjective: 2 Days Post-Op Procedure(s) (LRB): COMPUTER ASSISTED TOTAL KNEE ARTHROPLASTY (Right) Patient reports pain as well-controlled.   Patient states she doesn't feel very well. Plan is to go Home after hospital stay. Negative for chest pain and shortness of breath Fever: no Gastrointestinal: negative for nausea and vomiting since yesterday.  Patient has not had a bowel movement.  Objective: Vital signs in last 24 hours: Temp:  [97.6 F (36.4 C)-98.2 F (36.8 C)] 98.1 F (36.7 C) (09/23 0428) Pulse Rate:  [85-104] 104 (09/23 0428) Resp:  [16-20] 16 (09/23 0428) BP: (147-172)/(61-79) 148/61 (09/23 0428) SpO2:  [86 %-95 %] 86 % (09/23 0428)  Intake/Output from previous day:  Intake/Output Summary (Last 24 hours) at 01/27/2021 0830 Last data filed at 01/27/2021 0400 Gross per 24 hour  Intake 967.21 ml  Output 280 ml  Net 687.21 ml    Intake/Output this shift: No intake/output data recorded.  Labs: No results for input(s): HGB in the last 72 hours. No results for input(s): WBC, RBC, HCT, PLT in the last 72 hours. No results for input(s): NA, K, CL, CO2, BUN, CREATININE, GLUCOSE, CALCIUM in the last 72 hours. No results for input(s): LABPT, INR in the last 72 hours.   EXAM General - Patient is Alert, Appropriate, and Oriented Extremity - Neurovascular intact Dorsiflexion/Plantar flexion intact Compartment soft Dressing/Incision -Postoperative dressing remains in place., Polar Care in place and working. , Hemovac in place. , Following removal of post-op dressing, mild sanguinous drainage noted over distal half of incision Motor Function - intact, moving foot and toes well on exam.   Cardiovascular-  tachycardic rate, regular rhythm Respiratory- Lungs clear to auscultation bilaterally Gastrointestinal- soft, nontender, and active bowel sounds   Assessment/Plan: 2 Days Post-Op Procedure(s) (LRB): COMPUTER ASSISTED TOTAL KNEE ARTHROPLASTY (Right) Active Problems:    Total knee replacement status  Estimated body mass index is 44.32 kg/m as calculated from the following:   Height as of this encounter: 5\' 7"  (1.702 m).   Weight as of this encounter: 128.4 kg. Advance diet Up with therapy  Expect discharge later today if patient completes PT and improves overall.  Spoke with nursing to recheck vitals.   Post-op dressing removed. , Hemovac removed., Mini compression dressing applied. , and Fresh honeycomb dressing applied.   DVT Prophylaxis - Lovenox, Ted hose, and foot pumps Weight-Bearing as tolerated to right leg  , PA-C [facility] 01/27/2021, 8:30 AM

## 2021-01-27 NOTE — Progress Notes (Signed)
Occupational Therapy Treatment Patient Details Name: ARRYN Rodriguez MRN: [id number] DOB: December 10, 1950 Today's Date: 01/27/2021   History of present illness 70 y/o female here s/p R TKA 9/21. PMHx includes acid reflux, DMII, HLD, HTN, L TKA 04/2008.   OT comments  Pt seen for brief OT tx. Pt declined out of chair ADL 2/2 fatigue. OT facilitated review of instruction and pt able to teach back proper technique for polar care, compression stockings, AE/DME for ADL, and falls prevention. Pt progressing towards goals, continues to benefit from skilled OT services.    Recommendations for follow up therapy are one component of a multi-disciplinary discharge planning process, led by the attending physician.  Recommendations may be updated based on patient status, additional functional criteria and insurance authorization.    Follow Up Recommendations  No OT follow up    Equipment Recommendations  3 in 1 bedside commode    Recommendations for Other Services      Precautions / Restrictions Precautions Precautions: Fall;Knee Restrictions Weight Bearing Restrictions: Yes RLE Weight Bearing: Weight bearing as tolerated       Mobility Bed Mobility               General bed mobility comments: deferred, up in recliner    Transfers          General transfer comment: pt declined, citing fatigue    Balance                          ADL either performed or assessed with clinical judgement   ADL Overall ADL's : Needs assistance/impaired                                       General ADL Comments: Pt continues to require MIN A for LB ADL     Vision       Perception     Praxis      Cognition Arousal/Alertness: Awake/alert Behavior During Therapy: WFL for tasks assessed/performed Overall Cognitive Status: Within Functional Limits for tasks assessed                                          Exercises Other Exercises Other  Exercises: OT facilitated review of instruction and pt able to teach back proper technique for polar care, compression stockings, AE/DME for ADL, and falls prevention.   Shoulder Instructions       General Comments Pt left on 1 L O2 (RN notified) SpO2 91-92%, no SOB    Pertinent Vitals/ Pain       Pain Assessment: 0-10 Pain Score: 6  Pain Location: R knee, thigh Pain Descriptors / Indicators: Aching;Sore Pain Intervention(s): Limited activity within patient's tolerance;Monitored during session;Repositioned;Patient requesting pain meds-RN notified  Home Living                                          Prior Functioning/Environment              Frequency  Min 2X/week        Progress Toward Goals  OT Goals(current goals can now be found in the care plan section)  Progress towards OT goals: Progressing toward  goals  Acute Rehab OT Goals Patient Stated Goal: go home OT Goal Formulation: With patient Time For Goal Achievement: 02/09/21 Potential to Achieve Goals: Good  Plan Discharge plan remains appropriate;Frequency remains appropriate    Co-evaluation                 AM-PAC OT "6 Clicks" Daily Activity     Outcome Measure   Help from another person eating meals?: None Help from another person taking care of personal grooming?: None Help from another person toileting, which includes using toliet, bedpan, or urinal?: A Little Help from another person bathing (including washing, rinsing, drying)?: A Little Help from another person to put on and taking off regular upper body clothing?: None Help from another person to put on and taking off regular lower body clothing?: A Little 6 Click Score: 21    End of Session    OT Visit Diagnosis: Other abnormalities of gait and mobility (R26.89);Muscle weakness (generalized) (M62.81);Pain Pain - Right/Left: Right Pain - part of body: Knee   Activity Tolerance Patient limited by fatigue   Patient  Left in chair;with call bell/phone within reach;with chair alarm set;Other (comment) (polar care in place)   Nurse Communication          Time: 3335-4562 OT Time Calculation (min): 9 min  Charges: OT General Charges $OT Visit: 1 Visit OT Treatments $Self Care/Home Management : 8-22 mins  Arman Filter., MPH, MS, OTR/L ascom [phone] 01/27/21, 12:57 PM

## 2021-01-27 NOTE — Care Management Important Message (Signed)
Important Message  Patient Details  Name: Krystal Rodriguez MRN: [id number] Date of Birth: 05-03-1951   Medicare Important Message Given:  N/A - LOS <3 / Initial given by admissions     Olegario Messier A Adylee Leonardo 01/27/2021, 9:03 AM

## 2021-01-27 NOTE — Progress Notes (Addendum)
Physical Therapy Treatment Patient Details Name: Krystal Rodriguez MRN: [id number] DOB: [birth year] Today's Date: 01/27/2021   History of Present Illness 70 y/o female here s/p R TKA 9/21. PMHx includes acid reflux, DMII, HLD, HTN, L TKA 04/2008.    PT Comments    Pt in restroom beginning of session, assisted back to recliner beginning of session. Pt noted 7-8/10 pain to R knee, medicated during session to reduce pain. Pt agreeable to treatment.  Progressed to stair training w/ BUE to L handrail on 1L O2 [place], MIN G. Pt attempts to lower her COM w/ increased trunk flexion and relying on UE for support, no LOB noted. Ambulated around nurse's station on room air w/ x 3 seated rest breaks due to fatigue, RW, MIN G. Oxygen readings 88-91% during amb. Placed back on 1 L post-session due to O2 levels 86-88% w/ RN approval. Skilled PT intervention is indicated to address deficits in function, mobility, and to return to PLOF as able.  HHPT remains current discharge recommendation.   Recommendations for follow up therapy are one component of a multi-disciplinary discharge planning process, led by the attending physician.  Recommendations may be updated based on patient status, additional functional criteria and insurance authorization.  Follow Up Recommendations  Home health PT;Follow surgeon's recommendation for DC plan and follow-up therapies     Equipment Recommendations  Rolling walker with 5" wheels    Recommendations for Other Services       Precautions / Restrictions   Fall, Knee, WBAT    Mobility  Bed Mobility               General bed mobility comments: Pt in restroom beginning of session    Transfers Overall transfer level: Needs assistance Equipment used: Rolling walker (2 wheeled) Transfers: Sit to/from Stand Sit to Stand: Min guard         General transfer comment: Cues for hand placement w/ RW, good concentric and eccentric control  Ambulation/Gait Ambulation/Gait  assistance: Min guard Gait Distance (Feet): 180 Feet Assistive device: Rolling walker (2 wheeled)       General Gait Details: On room air, O2 88-91%; Good use of LE WB, increased fatigue requiring x 3 seated rest breaks   Stairs Stairs: Yes Stairs assistance: Min guard Stair Management: One rail Left Number of Stairs: 4 General stair comments: 1 L O2 [place]; Relied on BUE support to R hand rail w/ lowered COM for stability; no LOB or instability noted   Wheelchair Mobility    Modified Rankin (Stroke Patients Only)       Balance Overall balance assessment: Needs assistance Sitting-balance support: Bilateral upper extremity supported;Feet supported Sitting balance-Leahy Scale: Good     Standing balance support: Bilateral upper extremity supported Standing balance-Leahy Scale: Fair Standing balance comment: requires BUE support, no signs of instability, as fatigue ensues does not have ability to accept moderate dynmaic challenges                            Cognition Arousal/Alertness: Awake/alert Behavior During Therapy: WFL for tasks assessed/performed Overall Cognitive Status: Within Functional Limits for tasks assessed                                        Exercises Other Exercises Other Exercises: Toilet > Recliner w/ min-gaurd, RW, pt able to perform pericare w/o physical  assist. Reminders for keeping RW close to BOS when washing hands    General Comments General comments (skin integrity, edema, etc.): Pt left on 1 L O2 (RN notified) SpO2 91-92%, no SOB      Pertinent Vitals/Pain Pain Assessment: 0-10 Pain Score: 7  Pain Location: R knee, thigh Pain Descriptors / Indicators: Aching;Sore Pain Intervention(s): Limited activity within patient's tolerance;Monitored during session;Repositioned;RN gave pain meds during session;Ice applied    Home Living                      Prior Function            PT Goals (current goals  can now be found in the care plan section) Progress towards PT goals: Progressing toward goals    Frequency    BID      PT Plan Current plan remains appropriate    Co-evaluation              AM-PAC PT "6 Clicks" Mobility   Outcome Measure  Help needed turning from your back to your side while in a flat bed without using bedrails?: None Help needed moving from lying on your back to sitting on the side of a flat bed without using bedrails?: A Little Help needed moving to and from a bed to a chair (including a wheelchair)?: None Help needed standing up from a chair using your arms (e.g., wheelchair or bedside chair)?: None Help needed to walk in hospital room?: A Little Help needed climbing 3-5 steps with a railing? : A Little 6 Click Score: 21    End of Session Equipment Utilized During Treatment: Gait belt Activity Tolerance: Patient tolerated treatment well;Patient limited by fatigue Patient left: with chair alarm set;with call bell/phone within reach;with family/visitor present;in chair Nurse Communication: Mobility status PT Visit Diagnosis: Muscle weakness (generalized) (M62.81);Difficulty in walking, not elsewhere classified (R26.2);Pain Pain - Right/Left: Right Pain - part of body: Knee     Time: 0910-1005 PT Time Calculation (min) (ACUTE ONLY): 55 min  Charges:                       [employer], SPT

## 2021-01-28 LAB — GLUCOSE, CAPILLARY: Glucose-Capillary: 140 mg/dL — ABNORMAL HIGH (ref 70–99)

## 2021-01-28 MED ORDER — TRAMADOL HCL 50 MG PO TABS: 50.0000 mg | ORAL_TABLET | ORAL | 0 refills | Status: AC | PRN

## 2021-01-28 MED ORDER — CELECOXIB 200 MG PO CAPS: 200.0000 mg | ORAL_CAPSULE | Freq: Two times a day (BID) | ORAL | 0 refills | Status: AC

## 2021-01-28 MED ORDER — OXYCODONE HCL 5 MG PO TABS: 5.0000 mg | ORAL_TABLET | ORAL | 0 refills | Status: AC | PRN

## 2021-01-28 MED ORDER — ENOXAPARIN SODIUM 40 MG/0.4ML IJ SOSY: 40.0000 mg | PREFILLED_SYRINGE | INTRAMUSCULAR | 0 refills | Status: AC

## 2021-01-28 NOTE — Progress Notes (Signed)
  Subjective: 3 Days Post-Op Procedure(s) (LRB): COMPUTER ASSISTED TOTAL KNEE ARTHROPLASTY (Right) Patient reports pain as well-controlled.   Patient states she is ready to go home. Plan is to go Home after hospital stay. Negative for chest pain and shortness of breath Fever: no Gastrointestinal: negative for nausea and vomiting. Patient has had a BM.  Objective: Vital signs in last 24 hours: Temp:  [97.4 F (36.3 C)-98.8 F (37.1 C)] 98.8 F (37.1 C) (09/24 0801) Pulse Rate:  [98-111] 98 (09/24 0801) Resp:  [16-19] 18 (09/24 0801) BP: (126-167)/(55-79) 153/65 (09/24 0801) SpO2:  [89 %-100 %] 100 % (09/24 0801)  Intake/Output from previous day:  Intake/Output Summary (Last 24 hours) at 01/28/2021 0931 Last data filed at 01/27/2021 1300 Gross per 24 hour  Intake 0 ml  Output --  Net 0 ml    Intake/Output this shift: No intake/output data recorded.  Labs: Recent Labs    01/27/21 1913  HGB 9.2*   Recent Labs    01/27/21 1913  WBC 10.4  RBC 3.65*  HCT 29.5*  PLT 208   Recent Labs    01/27/21 1913  NA 134*  K 4.9  CL 101  CO2 29  BUN 20  CREATININE 0.91  GLUCOSE 149*  CALCIUM 8.1*   No results for input(s): LABPT, INR in the last 72 hours.   EXAM General - Patient is Alert, Appropriate, and Oriented Extremity - Neurovascular intact Dorsiflexion/Plantar flexion intact Compartment soft Dressing/Incision -Honeycomb dressing intact to the right knee without drainage noted. Motor Function - intact, moving foot and toes well on exam.   Cardiovascular- Regular rate and rhythm, no murmurs/rubs/gallops Respiratory- Lungs clear to auscultation bilaterally Gastrointestinal- soft, nontender, and active bowel sounds  Assessment/Plan: 3 Days Post-Op Procedure(s) (LRB): COMPUTER ASSISTED TOTAL KNEE ARTHROPLASTY (Right) Active Problems:   Total knee replacement status  Estimated body mass index is 44.32 kg/m as calculated from the following:   Height as of  this encounter: 5\' 7"  (1.702 m).   Weight as of this encounter: 128.4 kg. Advance diet Up with therapy  Patient states that she feels better today and is wanting to go home. Patient is at 100% on room air.  Denies any chest pain or SOB. Plan for discharge home with HHPT today.  DVT Prophylaxis - Lovenox, Ted hose, and foot pumps Weight-Bearing as tolerated to right leg  J. , PA-C [facility] 01/28/2021, 9:31 AM

## 2021-01-28 NOTE — Discharge Summary (Addendum)
Physician Discharge Summary  Patient ID: Krystal Rodriguez MRN: [id number] DOB/AGE: 05-Oct-1950 70 y.o.  Admit date: 01/25/2021 Discharge date: 01/28/2021  Admission Diagnoses:  Total knee replacement status [Z96.659]  Discharge Diagnoses: Patient Active Problem List   Diagnosis Date Noted   Total knee replacement status 01/25/2021   History of normocytic normochromic anemia 12/16/2018   Status post total left knee replacement 11/20/2017   Pure hypercholesterolemia 10/19/2013   Essential hypertension 09/16/2013   GERD (gastroesophageal reflux disease) 09/16/2013   Morbid obesity with BMI of 45.0-49.9, adult (HCC) 09/16/2013   Post-menopause 09/16/2013   Seasonal allergies 09/16/2013   Type 2 diabetes mellitus with hyperlipidemia (HCC) 09/16/2013   Osteoarthrosis, unspecified whether generalized or localized, lower leg 08/24/2013   Past Medical History:  Diagnosis Date   Arthritis    COVID-19 10/2020   DDD (degenerative disc disease), lumbar    Diabetes mellitus without complication (HCC)    GERD (gastroesophageal reflux disease)    Hypertension    PONV (postoperative nausea and vomiting)    n/v x 1-gallbladder     Transfusion: None.   Consultants (if any):   Discharged Condition: Improved  Hospital Course: Krystal Rodriguez is an 70 y.o. female who was admitted 01/25/2021 with a diagnosis of primary degenerative arthrosis of the right knee and went to the operating room on 01/25/2021 and underwent the above named procedures.    Surgeries: Procedure(s): COMPUTER ASSISTED TOTAL KNEE ARTHROPLASTY on 01/25/2021 Patient tolerated the surgery well. Taken to PACU where she was stabilized and then transferred to the orthopedic floor.  Started on Lovenox 30 q 12 hrs. Foot pumps applied bilaterally at 80 mm. Heels elevated on bed with rolled towels. No evidence of DVT. Negative Homan. Physical therapy started on day #1 for gait training and transfer. OT started day #1 for ADL and assisted  devices.  Patient's was removed on POD3.  Foley was removed shortly after surgery and Hemovac drain was removed on POD2.  Implants: DePuy Attune size 7 posterior stabilized femoral component (cemented), size 6 rotating platform tibial component (cemented), 38 mm medialized dome patella (cemented), and a 5 mm stabilized rotating platform polyethylene insert.  She was given perioperative antibiotics:  Anti-infectives (From admission, onward)    Start     Dose/Rate Route Frequency Ordered Stop   01/25/21 2245  ceFAZolin (ANCEF) IVPB 2g/100 mL premix        2 g 200 mL/hr over 30 Minutes Intravenous Every 6 hours 01/25/21 2159 01/26/21 0529   01/25/21 0600  ceFAZolin (ANCEF) IVPB 3g/100 mL premix  Status:  Discontinued        3 g 200 mL/hr over 30 Minutes Intravenous On call to O.R. 01/25/21 1607 01/25/21 1931     .  She was given sequential compression devices, early ambulation, and Lovenox for DVT prophylaxis.  She benefited maximally from the hospital stay and there were no complications.    Recent vital signs:  Vitals:   01/28/21 0334 01/28/21 0801  BP: 126/79 (!) 153/65  Pulse: (!) 105 98  Resp: 19 18  Temp: 98.4 F (36.9 C) 98.8 F (37.1 C)  SpO2: 90% 100%    Recent laboratory studies:  Lab Results  Component Value Date   HGB 9.2 (L) 01/27/2021   HGB 11.7 (L) 01/12/2021   HGB 10.8 (L) 10/25/2020   Lab Results  Component Value Date   WBC 10.4 01/27/2021   PLT 208 01/27/2021   Lab Results  Component Value Date   INR 1.0  01/12/2021   Lab Results  Component Value Date   NA 134 (L) 01/27/2021   K 4.9 01/27/2021   CL 101 01/27/2021   CO2 29 01/27/2021   BUN 20 01/27/2021   CREATININE 0.91 01/27/2021   GLUCOSE 149 (H) 01/27/2021    Discharge Medications:   Allergies as of 01/28/2021   No Known Allergies      Medication List     STOP taking these medications    aspirin EC 81 MG tablet       TAKE these medications    amoxicillin 500 MG  capsule Commonly known as: AMOXIL Take 2,000 mg by mouth See admin instructions. Before dental procedures   celecoxib 200 MG capsule Commonly known as: CELEBREX Take 1 capsule (200 mg total) by mouth 2 (two) times daily.   enalapril 10 MG tablet Commonly known as: VASOTEC Take 10 mg by mouth every morning.   enoxaparin 40 MG/0.4ML injection Commonly known as: LOVENOX Inject 0.4 mLs (40 mg total) into the skin daily.   levocetirizine 5 MG tablet Commonly known as: XYZAL Take 5 mg by mouth at bedtime.   metFORMIN 1000 MG tablet Commonly known as: GLUCOPHAGE Take 1,000 mg by mouth 2 (two) times daily with a meal.   montelukast 10 MG tablet Commonly known as: SINGULAIR Take 10 mg by mouth every evening.   omeprazole 20 MG capsule Commonly known as: PRILOSEC Take 20 mg by mouth every morning.   oxyCODONE 5 MG immediate release tablet Commonly known as: Oxy IR/ROXICODONE Take 1 tablet (5 mg total) by mouth every 4 (four) hours as needed for moderate pain (pain score 4-6).   pravastatin 10 MG tablet Commonly known as: PRAVACHOL Take 10 mg by mouth every evening.   traMADol 50 MG tablet Commonly known as: ULTRAM Take 1 tablet (50 mg total) by mouth every 4 (four) hours as needed for moderate pain.   triamterene-hydrochlorothiazide 37.5-25 MG tablet Commonly known as: MAXZIDE-25 Take 1 tablet by mouth every morning.   Vitamin D3 125 MCG (5000 UT) Caps Take 5,000 Units by mouth daily.               Durable Medical Equipment  (From admission, onward)           Start     Ordered   01/25/21 2200  DME Walker rolling  Once       Question:  Patient needs a walker to treat with the following condition  Answer:  Total knee replacement status   01/25/21 2159   01/25/21 2200  DME Bedside commode  Once       Question:  Patient needs a bedside commode to treat with the following condition  Answer:  Total knee replacement status   01/25/21 2159             Diagnostic Studies: DG Knee Right Port  Result Date: 01/25/2021 CLINICAL DATA:  Right knee replacement. EXAM: PORTABLE RIGHT KNEE - 1-2 VIEW COMPARISON:  None. FINDINGS: There is a total right knee arthroplasty. The arthroplasty components appear intact and in anatomic alignment. There is no acute fracture or dislocation. Postsurgical changes with small pockets of air in the joint effusion. Drainage catheter noted in the patellar femoral joint space. Anterior knee cutaneous clips. IMPRESSION: Total right knee arthroplasty. No acute osseous abnormality. Electronically Signed   By: Elgie Collard M.D.   On: 01/25/2021 19:54    Disposition: Plan for discharge home with HHPT today.   Follow-up Information  Lasandra Beech B, PA-C Follow up on 02/09/2021.   Specialty: Orthopedic Surgery Why: at 9:45am Contact information: [address] [facility] [place] [address] [phone]         Donato Heinz, MD Follow up on 03/07/2021.   Specialty: Orthopedic Surgery Why: at 10:45am Contact information: [address] [facility] [address] [address] [facility] [place] [address] [phone]                  Signed: Meriel Pica PA-C 01/28/2021, 9:39 AM

## 2021-01-28 NOTE — TOC Transition Note (Signed)
Transition of Care [facility]) - CM/SW Discharge Note   Patient Details  Name: Krystal Rodriguez MRN: [id number] Date of Birth: 1951-02-23  [facility] of [facility] CM/SW Contact:  Luvenia Redden, RN Phone [phone] 01/28/2021, 10:31 AM   Clinical Narrative:    Spoke with pt today concerning her discharge. Pt confirms transportation source for pick up today, support to all medical appointments and obtaining her prescribed medications. Pt also confirmed she received her rolling walker for ambulation. Confirmed [facility] and contacted Barbara Cower concerning pt's discharge today. No other needs or inquires presented.  TOC remains available for any other needs.   Final next level of care: Home w Home Health Services Barriers to Discharge: Continued Medical Work up   Patient Goals and CMS Choice        Discharge Placement                       Discharge Plan and Services   Discharge Planning Services: CM Consult            DME Arranged: Walker rolling DME Agency: [employer] Date DME Agency Contacted: 01/19/21 Time DME Agency Contacted: [phone] Representative spoke with at DME Agency: [facility] HH Arranged: PT HH Agency: [facility] Date HH Agency Contacted: 01/26/21 Time HH Agency Contacted: [phone] Representative spoke with at [facility] Agency: Barbara Cower  Social Determinants of Health (SDOH) Interventions     Readmission Risk Interventions No flowsheet data found.

## 2021-01-28 NOTE — Progress Notes (Signed)
Physical Therapy Treatment Patient Details Name: Krystal Rodriguez MRN: [id number] DOB: [birth year] Today's Date: 01/28/2021   History of Present Illness 70 y/o female here s/p R TKA 9/21. PMHx includes acid reflux, DMII, HLD, HTN, L TKA 04/2008.    PT Comments    Pt tolerated treatment fair today, but was overall limited with participation and progress secondary to lethargy and tachycardia. HR elevated throughout session, ranging from 101-130 bpm. Initially on RA upon entry with SpO2 at 88%, able to recover >90% with 1L O2 via [place]. RN notified of vitals. Despite lethargy and tachycardia, pt able to improve overall ambulation distance since last session. Pt grossly required min assist for bed mobility, mod assist for transfers, and CGA for safety with gait. Pt will continue to benefit from skilled acute PT services to address deficits for return to baseline function. Will continue to recommend HHPT and RW at DC.     Recommendations for follow up therapy are one component of a multi-disciplinary discharge planning process, led by the attending physician.  Recommendations may be updated based on patient status, additional functional criteria and insurance authorization.  Follow Up Recommendations  Home health PT;Follow surgeon's recommendation for DC plan and follow-up therapies     Equipment Recommendations  Rolling walker with 5" wheels       Precautions / Restrictions Precautions Precautions: Fall;Knee Precaution Booklet Issued: Yes (comment) Precaution Comments: HEP Restrictions Weight Bearing Restrictions: Yes RLE Weight Bearing: Weight bearing as tolerated     Mobility  Bed Mobility Overal bed mobility: Needs Assistance Bed Mobility: Sit to Supine       Sit to supine: Min assist   General bed mobility comments: BLE facilitation onto bed; increased time/effort    Transfers Overall transfer level: Needs assistance Equipment used: Rolling walker (2 wheeled) Transfers: Sit  to/from Stand Sit to Stand: Mod assist         General transfer comment: cues for hand placement; increased time/effort  Ambulation/Gait Ambulation/Gait assistance: Min guard Gait Distance (Feet): 100 Feet (34ft x1, 35ft x1 with seated rest break) Assistive device: Rolling walker (2 wheeled)   Gait velocity: decreased   General Gait Details: Step to gait progressing to early reciprocal gait. Antalgic gait with decreased R stance and L step length. Verbal cues for RW proximity. HR reaching 130bpm max during gait. x1 seated rest break.     Balance Overall balance assessment: Needs assistance Sitting-balance support: Bilateral upper extremity supported;Feet supported Sitting balance-Leahy Scale: Good     Standing balance support: Bilateral upper extremity supported;During functional activity Standing balance-Leahy Scale: Fair Standing balance comment: requires BUE support, no signs of instability, as fatigue ensues does not have ability to accept moderate dynmaic challenges          Cognition Arousal/Alertness: Awake/alert;Lethargic (fatigued) Behavior During Therapy: WFL for tasks assessed/performed Overall Cognitive Status: Within Functional Limits for tasks assessed            General Comments: fatigued      Exercises Other Exercises Other Exercises: Able to participate in bed mobility, transfers, and x2 bouts of gait with RW. Due to lethargy/fatigue and tachycardia, functional mobility limited. Increased assist for STS transfers. Other Exercises: Pt/spouse educated regarding: PT role/POC, DC recommendations, polar care management, HR. They verbalized understanding.    General Comments General comments (skin integrity, edema, etc.): at 88% on RA upon entry; left on 1L O2 via [place]      Pertinent Vitals/Pain Pain Assessment: 0-10 Pain Score: 2  Pain Location: R  knee, thigh Pain Intervention(s): Limited activity within patient's tolerance;Monitored during  session;Repositioned           PT Goals (current goals can now be found in the care plan section) Acute Rehab PT Goals Patient Stated Goal: go home PT Goal Formulation: With patient Time For Goal Achievement: 02/09/21 Potential to Achieve Goals: Good Progress towards PT goals: Progressing toward goals    Frequency    BID      PT Plan Current plan remains appropriate       AM-PAC PT "6 Clicks" Mobility   Outcome Measure  Help needed turning from your back to your side while in a flat bed without using bedrails?: A Little Help needed moving from lying on your back to sitting on the side of a flat bed without using bedrails?: A Little Help needed moving to and from a bed to a chair (including a wheelchair)?: A Little Help needed standing up from a chair using your arms (e.g., wheelchair or bedside chair)?: A Lot Help needed to walk in hospital room?: A Little Help needed climbing 3-5 steps with a railing? : A Lot 6 Click Score: 16    End of Session Equipment Utilized During Treatment: Gait belt;Oxygen Activity Tolerance: Patient limited by fatigue Patient left: in bed;with SCD's reapplied;with call bell/phone within reach;with bed alarm set;with family/visitor present (foam wedge donned) Nurse Communication: Mobility status PT Visit Diagnosis: Muscle weakness (generalized) (M62.81);Difficulty in walking, not elsewhere classified (R26.2);Pain Pain - Right/Left: Right Pain - part of body: Knee     Time: 5537-4827 PT Time Calculation (min) (ACUTE ONLY): 30 min  Charges:  $Gait Training: 8-22 mins $Therapeutic Activity: 8-22 mins                     Vira Blanco, PT, DPT 12:32 PM,01/28/21

## 2021-01-28 NOTE — Progress Notes (Signed)
Pt D/C home via POV with husband.  All d/c papers reviewed with pt and she expressed understanding.  Pt left with both thigh high TEDs on.  All belongings were taken including bone foam, polar care and IS.  IV removed from R forearm without issue.  Pt is aware of her f/u appts with PA and MD.

## 2021-12-04 ENCOUNTER — Other Ambulatory Visit: Payer: Self-pay | Admitting: Family Medicine

## 2021-12-04 DIAGNOSIS — Z1231 Encounter for screening mammogram for malignant neoplasm of breast: Secondary | ICD-10-CM

## 2021-12-27 ENCOUNTER — Ambulatory Visit
Admission: RE | Admit: 2021-12-27 | Discharge: 2021-12-27 | Disposition: A | Discharge status: Home / Self Care | Payer: Medicare Other | Source: Ambulatory Visit | Attending: Family Medicine | Admitting: Family Medicine

## 2021-12-27 DIAGNOSIS — Z1231 Encounter for screening mammogram for malignant neoplasm of breast: Secondary | ICD-10-CM | POA: Insufficient documentation

## 2022-11-21 ENCOUNTER — Other Ambulatory Visit: Payer: Self-pay | Admitting: Family Medicine

## 2022-11-21 DIAGNOSIS — Z1231 Encounter for screening mammogram for malignant neoplasm of breast: Secondary | ICD-10-CM

## 2023-02-22 ENCOUNTER — Ambulatory Visit
Admission: RE | Admit: 2023-02-22 | Discharge: 2023-02-22 | Disposition: A | Discharge status: Home / Self Care | Payer: Medicare Other | Source: Ambulatory Visit | Attending: Family Medicine | Admitting: Family Medicine

## 2023-02-22 DIAGNOSIS — Z1231 Encounter for screening mammogram for malignant neoplasm of breast: Secondary | ICD-10-CM | POA: Diagnosis present

## 2023-04-24 IMAGING — MG MM DIGITAL SCREENING BILAT W/ TOMO AND CAD
6 of 13 series · 6 of 40 positions shown · non-contrast
Comparison: Previous exam(s).

ACR Breast Density Category a: The breast tissue is almost entirely
fatty.

CLINICAL DATA: Screening.

EXAM:
DIGITAL SCREENING BILATERAL MAMMOGRAM WITH TOMOSYNTHESIS AND CAD
TECHNIQUE: Bilateral screening digital craniocaudal and mediolateral oblique
mammograms were obtained. Bilateral screening digital breast
tomosynthesis was performed. The images were evaluated with
computer-aided detection.

[R CC synth-2D]
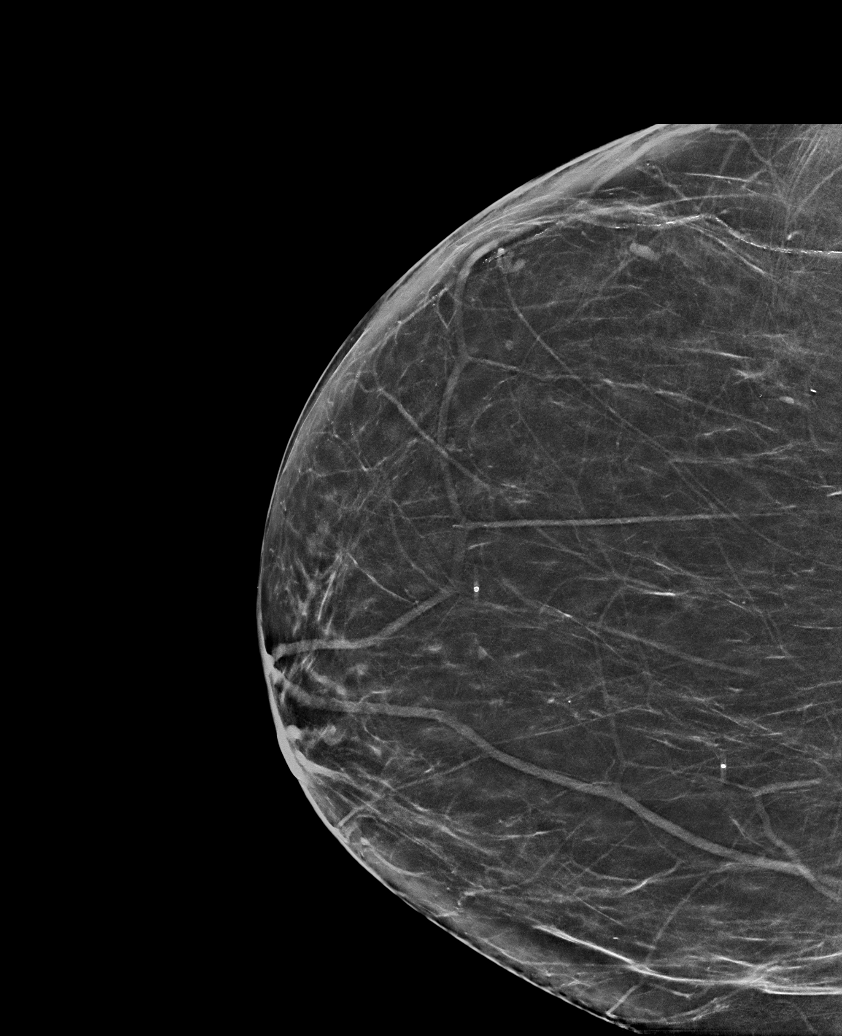

[L MLO synth-2D (1 of 2)]
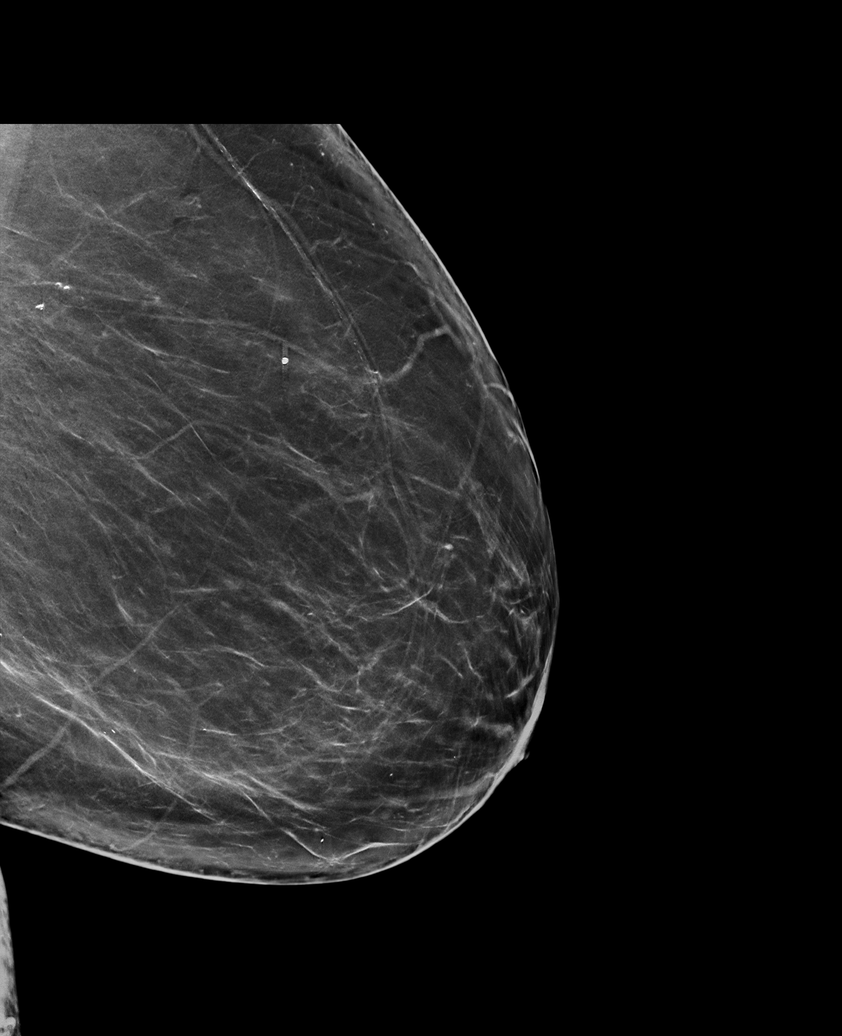

[R MLO synth-2D]
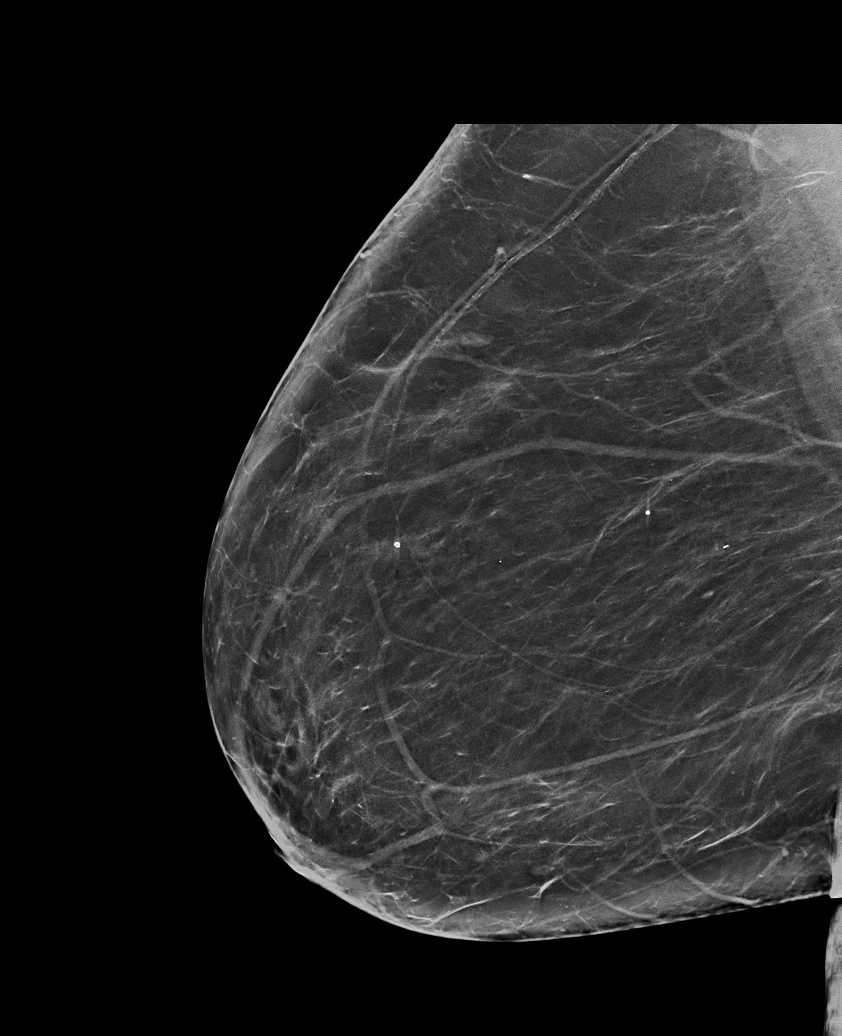

[L CC synth-2D (1 of 2)]
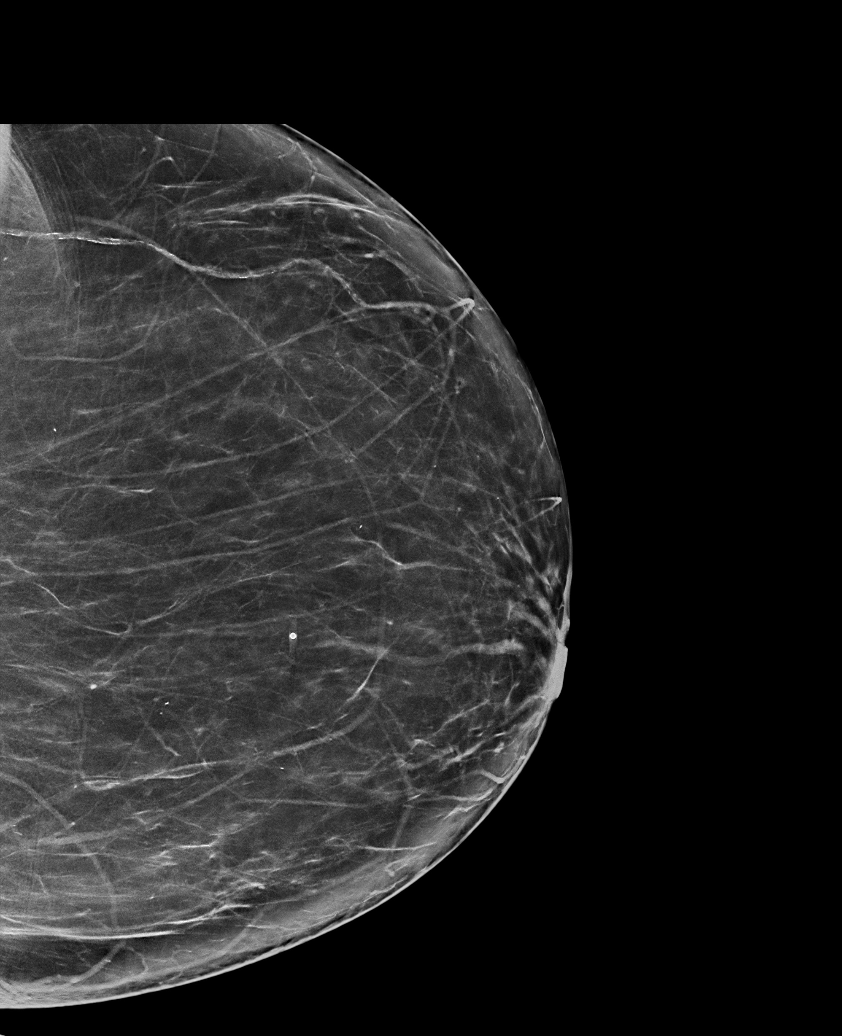

[L CC synth-2D (2 of 2)]
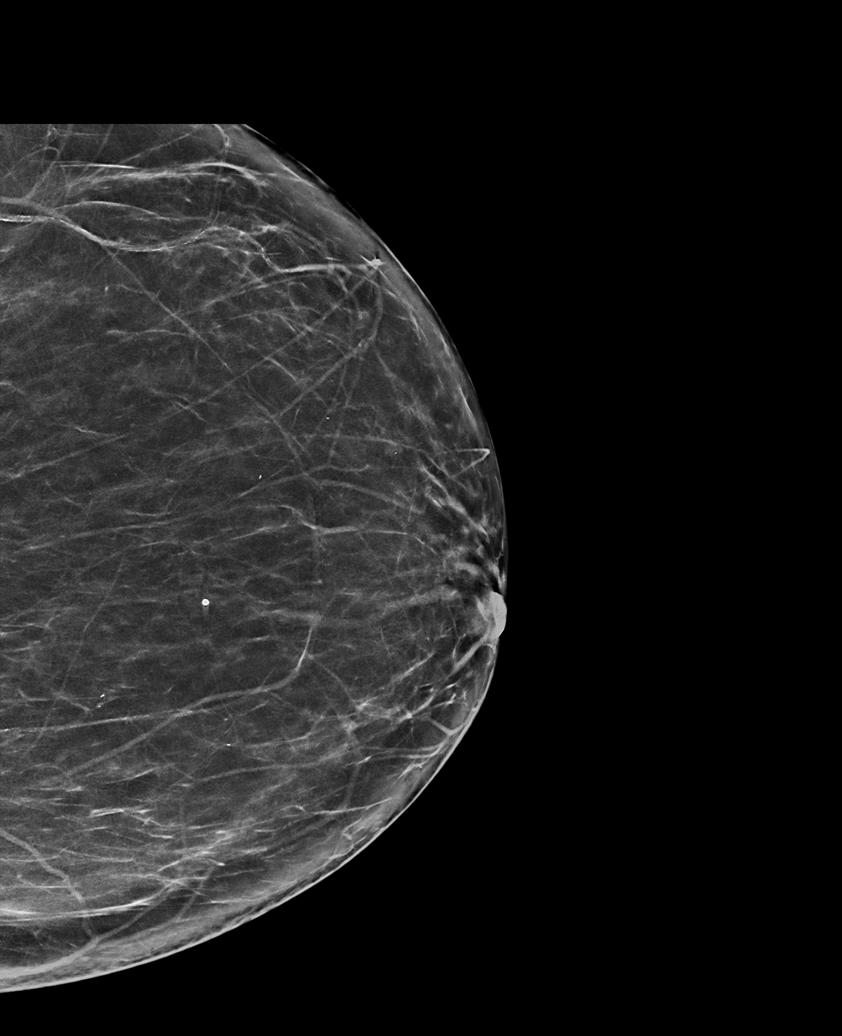

[L MLO synth-2D (2 of 2)]
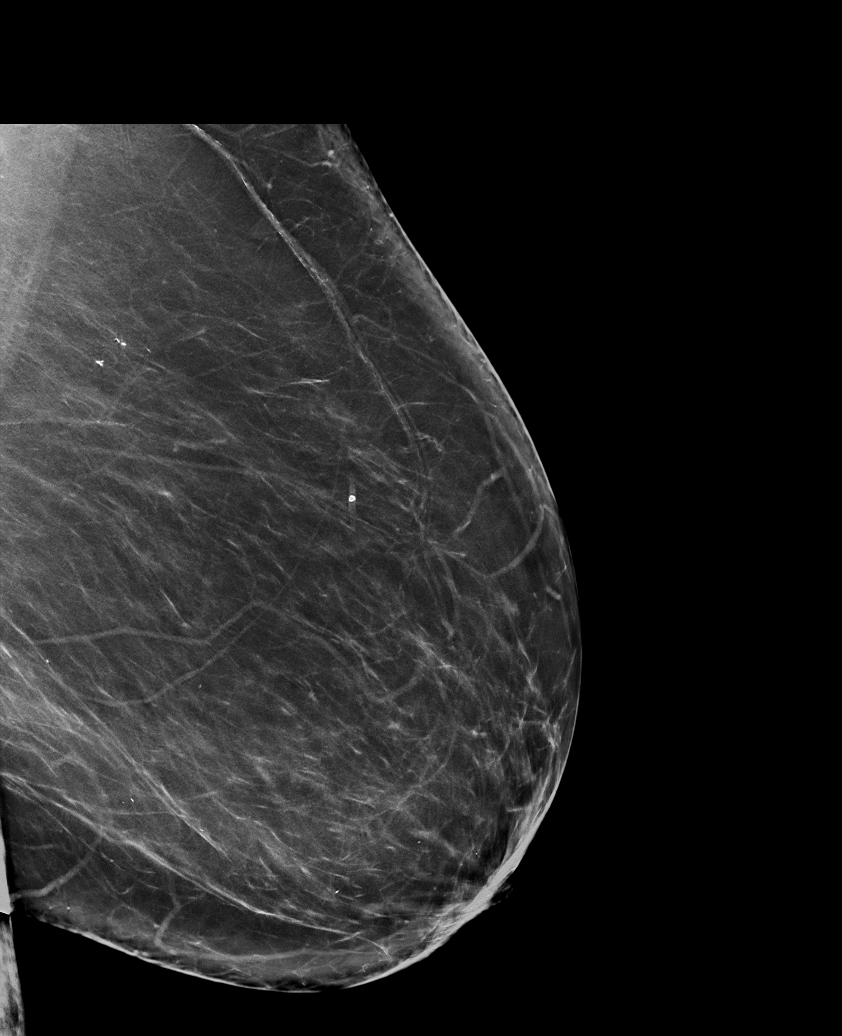

[6 of 40 positions shown; findings below may reference images not displayed]

FINDINGS: There are no findings suspicious for malignancy.
IMPRESSION: No mammographic evidence of malignancy. A result letter of this
screening mammogram will be mailed directly to the patient.

RECOMMENDATION:
Screening mammogram in one year. (Code:0E-3-N98)

BI-RADS CATEGORY  1: Negative.

## 2024-01-21 ENCOUNTER — Other Ambulatory Visit: Payer: Self-pay | Admitting: Otolaryngology

## 2024-01-21 DIAGNOSIS — H9213 Otorrhea, bilateral: Secondary | ICD-10-CM

## 2024-01-24 ENCOUNTER — Ambulatory Visit
Admission: RE | Admit: 2024-01-24 | Discharge: 2024-01-24 | Disposition: A | Discharge status: Home / Self Care | Source: Ambulatory Visit | Attending: Otolaryngology | Admitting: Otolaryngology

## 2024-01-24 DIAGNOSIS — H9213 Otorrhea, bilateral: Secondary | ICD-10-CM

## 2024-02-24 ENCOUNTER — Other Ambulatory Visit: Payer: Self-pay | Admitting: Family Medicine

## 2024-02-24 ENCOUNTER — Encounter: Payer: Self-pay | Admitting: Family Medicine

## 2024-02-24 DIAGNOSIS — Z1231 Encounter for screening mammogram for malignant neoplasm of breast: Secondary | ICD-10-CM

## 2024-03-27 ENCOUNTER — Ambulatory Visit
Admission: RE | Admit: 2024-03-27 | Discharge: 2024-03-27 | Disposition: A | Discharge status: Home / Self Care | Source: Ambulatory Visit | Attending: Family Medicine | Admitting: Family Medicine

## 2024-03-27 DIAGNOSIS — Z1231 Encounter for screening mammogram for malignant neoplasm of breast: Secondary | ICD-10-CM | POA: Diagnosis present
